# Patient Record
Sex: Male | Born: 1970 | Race: White | Hispanic: No | Marital: Married | Smoking: Never smoker
Health system: Southern US, Community
[De-identification: ages and names within clinical notes are randomized; demographics above are authoritative.]

## PROBLEM LIST (undated history)

## (undated) DIAGNOSIS — D126 Benign neoplasm of colon, unspecified: Secondary | ICD-10-CM

## (undated) DIAGNOSIS — T7840XA Allergy, unspecified, initial encounter: Secondary | ICD-10-CM

## (undated) DIAGNOSIS — R519 Headache, unspecified: Secondary | ICD-10-CM

## (undated) DIAGNOSIS — G4700 Insomnia, unspecified: Secondary | ICD-10-CM

## (undated) DIAGNOSIS — R931 Abnormal findings on diagnostic imaging of heart and coronary circulation: Secondary | ICD-10-CM

## (undated) DIAGNOSIS — C801 Malignant (primary) neoplasm, unspecified: Secondary | ICD-10-CM

## (undated) DIAGNOSIS — R0600 Dyspnea, unspecified: Secondary | ICD-10-CM

## (undated) DIAGNOSIS — R0609 Other forms of dyspnea: Secondary | ICD-10-CM

## (undated) DIAGNOSIS — R3129 Other microscopic hematuria: Secondary | ICD-10-CM

## (undated) DIAGNOSIS — E785 Hyperlipidemia, unspecified: Secondary | ICD-10-CM

## (undated) DIAGNOSIS — Z9081 Acquired absence of spleen: Secondary | ICD-10-CM

## (undated) DIAGNOSIS — L4050 Arthropathic psoriasis, unspecified: Secondary | ICD-10-CM

## (undated) DIAGNOSIS — R011 Cardiac murmur, unspecified: Secondary | ICD-10-CM

## (undated) DIAGNOSIS — E079 Disorder of thyroid, unspecified: Secondary | ICD-10-CM

## (undated) DIAGNOSIS — C8190 Hodgkin lymphoma, unspecified, unspecified site: Secondary | ICD-10-CM

## (undated) DIAGNOSIS — J984 Other disorders of lung: Secondary | ICD-10-CM

## (undated) HISTORY — PX: UPPER GASTROINTESTINAL ENDOSCOPY: SHX188

## (undated) HISTORY — DX: Malignant (primary) neoplasm, unspecified: C80.1

## (undated) HISTORY — DX: Other forms of dyspnea: R06.09

## (undated) HISTORY — DX: Hyperlipidemia, unspecified: E78.5

## (undated) HISTORY — DX: Headache, unspecified: R51.9

## (undated) HISTORY — DX: Acquired absence of spleen: Z90.81

## (undated) HISTORY — DX: Hodgkin lymphoma, unspecified, unspecified site: C81.90

## (undated) HISTORY — DX: Arthropathic psoriasis, unspecified: L40.50

## (undated) HISTORY — DX: Disorder of thyroid, unspecified: E07.9

## (undated) HISTORY — PX: ABDOMINAL EXPLORATION SURGERY: SHX538

## (undated) HISTORY — DX: Insomnia, unspecified: G47.00

## (undated) HISTORY — DX: Dyspnea, unspecified: R06.00

## (undated) HISTORY — DX: Abnormal findings on diagnostic imaging of heart and coronary circulation: R93.1

## (undated) HISTORY — DX: Allergy, unspecified, initial encounter: T78.40XA

## (undated) HISTORY — DX: Other disorders of lung: J98.4

## (undated) HISTORY — DX: Benign neoplasm of colon, unspecified: D12.6

## (undated) HISTORY — DX: Other microscopic hematuria: R31.29

## (undated) HISTORY — DX: Cardiac murmur, unspecified: R01.1

---

## 1997-09-17 ENCOUNTER — Encounter: Payer: Self-pay | Admitting: Endocrinology

## 2005-06-20 ENCOUNTER — Encounter: Payer: Self-pay | Admitting: Endocrinology

## 2006-05-21 ENCOUNTER — Encounter: Payer: Self-pay | Admitting: Endocrinology

## 2007-07-02 ENCOUNTER — Ambulatory Visit: Payer: Self-pay | Admitting: Endocrinology

## 2007-07-02 DIAGNOSIS — C8190 Hodgkin lymphoma, unspecified, unspecified site: Secondary | ICD-10-CM | POA: Insufficient documentation

## 2007-07-02 DIAGNOSIS — E032 Hypothyroidism due to medicaments and other exogenous substances: Secondary | ICD-10-CM | POA: Insufficient documentation

## 2007-07-02 LAB — CONVERTED CEMR LAB: TSH: 1.25 microintl units/mL (ref 0.35–5.50)

## 2013-02-02 ENCOUNTER — Other Ambulatory Visit (HOSPITAL_COMMUNITY): Payer: Self-pay

## 2013-02-09 ENCOUNTER — Encounter (INDEPENDENT_AMBULATORY_CARE_PROVIDER_SITE_OTHER): Payer: Self-pay

## 2013-02-09 ENCOUNTER — Other Ambulatory Visit (HOSPITAL_COMMUNITY): Payer: Self-pay | Admitting: Internal Medicine

## 2013-02-09 ENCOUNTER — Ambulatory Visit (HOSPITAL_COMMUNITY)
Admission: RE | Admit: 2013-02-09 | Discharge: 2013-02-09 | Disposition: A | Discharge status: Home / Self Care | Payer: BC Managed Care – PPO | Source: Ambulatory Visit | Attending: Surgery | Admitting: Surgery

## 2013-02-09 DIAGNOSIS — I6529 Occlusion and stenosis of unspecified carotid artery: Secondary | ICD-10-CM

## 2013-02-09 DIAGNOSIS — R0989 Other specified symptoms and signs involving the circulatory and respiratory systems: Secondary | ICD-10-CM

## 2015-11-01 ENCOUNTER — Ambulatory Visit: Payer: Self-pay

## 2015-11-01 ENCOUNTER — Encounter: Payer: Self-pay | Admitting: Podiatry

## 2015-11-01 ENCOUNTER — Ambulatory Visit (INDEPENDENT_AMBULATORY_CARE_PROVIDER_SITE_OTHER): Payer: BLUE CROSS/BLUE SHIELD | Admitting: Podiatry

## 2015-11-01 VITALS — BP 153/82 | HR 89 | Resp 16 | Ht 77.0 in | Wt 177.0 lb

## 2015-11-01 DIAGNOSIS — M722 Plantar fascial fibromatosis: Secondary | ICD-10-CM | POA: Diagnosis not present

## 2015-11-01 DIAGNOSIS — B070 Plantar wart: Secondary | ICD-10-CM | POA: Diagnosis not present

## 2015-11-01 NOTE — Patient Instructions (Signed)

## 2015-11-01 NOTE — Progress Notes (Signed)
   Subjective:    Patient ID: Ian Goodman, male    DOB: May 05, 1970, 45 y.o.   MRN: [id number]  HPI: He presents today as a new patient with a chief complaint of a wart to the plantar aspect of the left foot. He states it has been present for the past 6-7 years. He states that he has to trim and occasionally but for the most part it really doesn't bother him. He likes to exercise and play tennis and on occasion it will hurt with medial lateral compression.  Review of Systems  All other systems reviewed and are negative.      Objective:   Physical Exam: Vital signs are stable alert and oriented 3. Pulses are palpable. Neurologic sensorium is intact. Degenerative flexors are intact. Muscle strength is 5 over 5 dorsiflexion plantar flexors and inverters and everters all intrinsic musculature is intact. Orthopedic evaluation and strict all joints distal to the ankle have a full range of motion without crepitation. Cutaneous evaluation demonstrates supple well-hydrated cutis E has pinch calluses to the medial aspect of the hallux interphalangeal joints bilaterally he also has calluses to the plantar medial aspect of the first metatarsophalangeal joints bilaterally. A verrucoid lesion solitary nature measuring approximately 1 cm in diameter centrally located plantar aspect of the left heel is present. No signs of bacterial infection. No skin breakdown.        Assessment & Plan:  Assessment: Verruca plantaris left heel.  Plan: Surgical excision/curettage performed today after local anesthetic was administered beneath the lesion. He tolerated this procedure well without complications. The lesion was sent for pathologic evaluation. He was provided with both oral and written home-going instructions for care and soaking of the foot and I will follow-up with him in 1-2 weeks just to reevaluate the surgical site.

## 2015-11-15 ENCOUNTER — Encounter: Payer: Self-pay | Admitting: Podiatry

## 2015-11-15 ENCOUNTER — Ambulatory Visit (INDEPENDENT_AMBULATORY_CARE_PROVIDER_SITE_OTHER): Payer: BLUE CROSS/BLUE SHIELD | Admitting: Podiatry

## 2015-11-15 DIAGNOSIS — B070 Plantar wart: Secondary | ICD-10-CM

## 2015-11-15 NOTE — Patient Instructions (Signed)

## 2015-11-15 NOTE — Progress Notes (Signed)
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 He states that is doing much better than it did take a few days for do feel improved. He continues to apply a small amount of Neosporin and a Band-Aid. He denies any signs of infection.  Objective: Vital signs are stable alert and oriented 3. Pulses. Lesion to the plantar aspect of the left heel has nearly completely healed at this point. No signs of infection and no drainage no odor.  Assessment: Well-healing surgical curettage left foot.  Plan: Follow-up with [place] on an as-needed basis covered during the day time to completely healed and leave open at bedtime.

## 2016-06-27 ENCOUNTER — Other Ambulatory Visit: Payer: Self-pay | Admitting: Internal Medicine

## 2016-06-27 DIAGNOSIS — R0989 Other specified symptoms and signs involving the circulatory and respiratory systems: Secondary | ICD-10-CM

## 2016-07-04 ENCOUNTER — Other Ambulatory Visit: Payer: Self-pay

## 2016-07-09 ENCOUNTER — Other Ambulatory Visit: Payer: Self-pay

## 2016-07-16 ENCOUNTER — Ambulatory Visit
Admission: RE | Admit: 2016-07-16 | Discharge: 2016-07-16 | Disposition: A | Discharge status: Home / Self Care | Payer: BLUE CROSS/BLUE SHIELD | Source: Ambulatory Visit | Attending: Internal Medicine | Admitting: Internal Medicine

## 2016-07-16 DIAGNOSIS — R0989 Other specified symptoms and signs involving the circulatory and respiratory systems: Secondary | ICD-10-CM

## 2018-06-12 DIAGNOSIS — M25512 Pain in left shoulder: Secondary | ICD-10-CM | POA: Insufficient documentation

## 2018-07-08 ENCOUNTER — Other Ambulatory Visit: Payer: Self-pay | Admitting: Internal Medicine

## 2018-07-08 DIAGNOSIS — R0600 Dyspnea, unspecified: Secondary | ICD-10-CM

## 2018-07-08 DIAGNOSIS — R0609 Other forms of dyspnea: Principal | ICD-10-CM

## 2018-07-21 ENCOUNTER — Other Ambulatory Visit: Payer: Self-pay | Admitting: Internal Medicine

## 2018-07-21 ENCOUNTER — Other Ambulatory Visit (HOSPITAL_COMMUNITY): Payer: Self-pay | Admitting: Internal Medicine

## 2018-07-21 DIAGNOSIS — R0600 Dyspnea, unspecified: Secondary | ICD-10-CM

## 2018-07-21 DIAGNOSIS — R0609 Other forms of dyspnea: Secondary | ICD-10-CM

## 2018-07-21 DIAGNOSIS — R011 Cardiac murmur, unspecified: Secondary | ICD-10-CM

## 2018-09-19 ENCOUNTER — Encounter (HOSPITAL_COMMUNITY): Payer: Self-pay | Admitting: Internal Medicine

## 2018-10-20 ENCOUNTER — Encounter: Payer: Self-pay | Admitting: Internal Medicine

## 2019-04-27 ENCOUNTER — Telehealth: Payer: Self-pay | Admitting: Gastroenterology

## 2019-04-27 NOTE — Telephone Encounter (Signed)
Dr Ardis Hughs there were no dates that worked for him.  He is going to call back ina couple weeks when the the new schedule is out and try to find something at that time.

## 2019-04-27 NOTE — Telephone Encounter (Signed)
Can you see if he wants to do Friday 7:30 March 19th?  thanks

## 2019-04-27 NOTE — Telephone Encounter (Signed)
He goes by [employer].  He's a friend of mine at routine risk for colon cancer.  Needs screening colonoscopy with me in next several weeks, he definitely prefers an early Friday morning case.  Can you contact him about it? He's reachable via his cell.   Thanks

## 2019-04-27 NOTE — Telephone Encounter (Signed)
Left message on machine to call back  

## 2019-04-28 ENCOUNTER — Other Ambulatory Visit: Payer: Self-pay

## 2019-04-28 DIAGNOSIS — Z1159 Encounter for screening for other viral diseases: Secondary | ICD-10-CM

## 2019-04-28 NOTE — Telephone Encounter (Signed)
The pt has been scheduled for 05/29/19 at 730 am colon with Dr Ardis Hughs at the [facility].  Previsit and COVID also scheduled.  Information given to the pt

## 2019-04-28 NOTE — Telephone Encounter (Signed)
The COVID appt has been cancelled due to the pt having the full vaccination for COVID.

## 2019-05-25 ENCOUNTER — Ambulatory Visit (AMBULATORY_SURGERY_CENTER): Payer: Self-pay | Admitting: *Deleted

## 2019-05-25 ENCOUNTER — Other Ambulatory Visit: Payer: Self-pay

## 2019-05-25 VITALS — Temp 97.4°F | Ht 77.0 in | Wt 179.8 lb

## 2019-05-25 DIAGNOSIS — Z1211 Encounter for screening for malignant neoplasm of colon: Secondary | ICD-10-CM

## 2019-05-25 MED ORDER — SUPREP BOWEL PREP KIT 17.5-3.13-1.6 GM/177ML PO SOLN: 1.0000 | Freq: Once | ORAL | 0 refills | Status: AC

## 2019-05-25 NOTE — Progress Notes (Signed)
Patient denies any allergies to egg or soy products. Patient denies complications with anesthesia/sedation.  Patient denies oxygen use at home and denies diet medications. Emmi instructions for colonoscopy/endoscopy explained and given to patient.  Suprep coupon given to patient.  Patient has had both covid vaccinations.  Last vaccination was on 04/21/19 per patient.

## 2019-05-27 ENCOUNTER — Telehealth: Payer: Self-pay

## 2019-05-27 ENCOUNTER — Encounter: Payer: Self-pay | Admitting: Gastroenterology

## 2019-05-27 NOTE — Telephone Encounter (Signed)
Called pt regarding cancelled COVID test that's required for upcoming scheduled colonoscopy.  No answer.  LMTCB (ok per DPR).

## 2019-05-29 ENCOUNTER — Other Ambulatory Visit: Payer: Self-pay

## 2019-05-29 ENCOUNTER — Encounter: Payer: Self-pay | Admitting: Gastroenterology

## 2019-05-29 ENCOUNTER — Ambulatory Visit (AMBULATORY_SURGERY_CENTER): Payer: BC Managed Care – PPO | Admitting: Gastroenterology

## 2019-05-29 VITALS — BP 114/67 | HR 85 | Temp 98.2°F | Resp 18 | Ht 77.0 in | Wt 179.8 lb

## 2019-05-29 DIAGNOSIS — D120 Benign neoplasm of cecum: Secondary | ICD-10-CM | POA: Diagnosis not present

## 2019-05-29 DIAGNOSIS — Z1211 Encounter for screening for malignant neoplasm of colon: Secondary | ICD-10-CM

## 2019-05-29 DIAGNOSIS — D123 Benign neoplasm of transverse colon: Secondary | ICD-10-CM | POA: Diagnosis not present

## 2019-05-29 DIAGNOSIS — D125 Benign neoplasm of sigmoid colon: Secondary | ICD-10-CM

## 2019-05-29 DIAGNOSIS — D122 Benign neoplasm of ascending colon: Secondary | ICD-10-CM | POA: Diagnosis not present

## 2019-05-29 MED ORDER — SODIUM CHLORIDE 0.9 % IV SOLN: 500.0000 mL | Freq: Once | INTRAVENOUS | Status: DC

## 2019-05-29 NOTE — Op Note (Signed)
[facility] Patient Name: Ian Goodman Procedure Date: 05/29/2019 7:21 AM MRN: [id number] Endoscopist: Milus Banister , MD Age: 49 Referring MD:  Date of Birth: 06/30/1970 Gender: Male Account #: [id number] Procedure:                Colonoscopy Indications:              Screening for colorectal malignant neoplasm Medicines:                Monitored Anesthesia Care Procedure:                Pre-Anesthesia Assessment:                           - Prior to the procedure, a History and Physical                            was performed, and patient medications and                            allergies were reviewed. The patient's tolerance of                            previous anesthesia was also reviewed. The risks                            and benefits of the procedure and the sedation                            options and risks were discussed with the patient.                            All questions were answered, and informed consent                            was obtained. Prior Anticoagulants: The patient has                            taken no previous anticoagulant or antiplatelet                            agents. ASA Grade Assessment: II - A patient with                            mild systemic disease. After reviewing the risks                            and benefits, the patient was deemed in                            satisfactory condition to undergo the procedure.                           After obtaining informed consent, the colonoscope  was passed under direct vision. Throughout the                            procedure, the patient's blood pressure, pulse, and                            oxygen saturations were monitored continuously. The                            Colonoscope was introduced through the anus and                            advanced to the the cecum, identified by                            appendiceal orifice and  ileocecal valve. The                            colonoscopy was performed without difficulty. The                            patient tolerated the procedure well. The quality                            of the bowel preparation was excellent. The                            ileocecal valve, appendiceal orifice, and rectum                            were photographed. Scope In: 7:35:27 AM Scope Out: 8:01:29 AM Scope Withdrawal Time: 0 hours 17 minutes 58 seconds  Total Procedure Duration: 0 hours 26 minutes 2 seconds  Findings:                 Nine sessile polyps were found in the transverse                            colon, ascending colon and cecum. The polyps were 2                            to 6 mm in size. These polyps were removed with a                            cold snare. Resection and retrieval were complete.                           Three semi-pedunculated polyps were found in the                            sigmoid colon. The polyps were 2 to 10 mm in size.                            These polyps  were removed with a cold snare.                            Resection and retrieval were complete.                           Internal hemorrhoids were found. The hemorrhoids                            were small.                           The exam was otherwise without abnormality on                            direct and retroflexion views. Complications:            No immediate complications. Estimated blood loss:                            None. Estimated Blood Loss:     Estimated blood loss: none. Impression:               - Nine 2 to 6 mm polyps in the transverse colon, in                            the ascending colon and in the cecum, removed with                            a cold snare. Resected and retrieved.                           - Three 2 to 10 mm polyps in the sigmoid colon,                            removed with a cold snare. Resected and retrieved.                            - Internal hemorrhoids.                           - The examination was otherwise normal on direct                            and retroflexion views. Recommendation:           - Patient has a contact number available for                            emergencies. The signs and symptoms of potential                            delayed complications were discussed with the                            patient. Return to normal activities  tomorrow.                            Written discharge instructions were provided to the                            patient.                           - Resume previous diet.                           - Continue present medications.                           - Await pathology results. Likely repeat in 1-3                            years pending path results. Milus Banister, MD 05/29/2019 8:05:22 AM This report has been signed electronically.

## 2019-05-29 NOTE — Patient Instructions (Signed)
Handouts on polyps and hemorrhoids given to you today  Await pathology results    YOU HAD AN ENDOSCOPIC PROCEDURE TODAY AT THE [facility]:   Refer to the procedure report that was given to you for any specific questions about what was found during the examination.  If the procedure report does not answer your questions, please call your gastroenterologist to clarify.  If you requested that your care partner not be given the details of your procedure findings, then the procedure report has been included in a sealed envelope for you to review at your convenience later.  YOU SHOULD EXPECT: Some feelings of bloating in the abdomen. Passage of more gas than usual.  Walking can help get rid of the air that was put into your GI tract during the procedure and reduce the bloating. If you had a lower endoscopy (such as a colonoscopy or flexible sigmoidoscopy) you may notice spotting of blood in your stool or on the toilet paper. If you underwent a bowel prep for your procedure, you may not have a normal bowel movement for a few days.  Please Note:  You might notice some irritation and congestion in your nose or some drainage.  This is from the oxygen used during your procedure.  There is no need for concern and it should clear up in a day or so.  SYMPTOMS TO REPORT IMMEDIATELY:   Following lower endoscopy (colonoscopy or flexible sigmoidoscopy):  Excessive amounts of blood in the stool  Significant tenderness or worsening of abdominal pains  Swelling of the abdomen that is new, acute  Fever of 100F or higher  For urgent or emergent issues, a gastroenterologist can be reached at any hour by calling [phone]. Do not use MyChart messaging for urgent concerns.    DIET:  We do recommend a small meal at first, but then you may proceed to your regular diet.  Drink plenty of fluids but you should avoid alcoholic beverages for 24 hours.  ACTIVITY:  You should plan to take it easy for the  rest of today and you should NOT DRIVE or use heavy machinery until tomorrow (because of the sedation medicines used during the test).    FOLLOW UP: Our staff will call the number listed on your records 48-72 hours following your procedure to check on you and address any questions or concerns that you may have regarding the information given to you following your procedure. If we do not reach you, we will leave a message.  We will attempt to reach you two times.  During this call, we will ask if you have developed any symptoms of COVID 19. If you develop any symptoms (ie: fever, flu-like symptoms, shortness of breath, cough etc.) before then, please call [phone].  If you test positive for Covid 19 in the 2 weeks post procedure, please call and report this information to us.    If any biopsies were taken you will be contacted by phone or by letter within the next 1-3 weeks.  Please call us at [phone] if you have not heard about the biopsies in 3 weeks.    SIGNATURES/CONFIDENTIALITY: You and/or your care partner have signed paperwork which will be entered into your electronic medical record.  These signatures attest to the fact that that the information above on your After Visit Summary has been reviewed and is understood.  Full responsibility of the confidentiality of this discharge information lies with you and/or your care-partner. 

## 2019-05-29 NOTE — Progress Notes (Signed)
Called to room to assist during endoscopic procedure.  Patient ID and intended procedure confirmed with present staff. Received instructions for my participation in the procedure from the performing physician.  

## 2019-05-29 NOTE — Progress Notes (Signed)
Pt's states no medical or surgical changes since previsit or office visit. 

## 2019-05-29 NOTE — Progress Notes (Signed)
PT taken to PACU. Monitors in place. VSS. Report given to RN. 

## 2019-06-02 ENCOUNTER — Telehealth: Payer: Self-pay | Admitting: *Deleted

## 2019-06-02 NOTE — Telephone Encounter (Signed)
  Follow up Call-  Call back number 05/29/2019  Post procedure Call Back phone  # [phone]  Permission to leave phone message Yes  Some recent data might be hidden    [facility]

## 2019-06-02 NOTE — Telephone Encounter (Signed)
  Follow up Call-  Call back number 05/29/2019  Post procedure Call Back phone  # [phone]  Permission to leave phone message Yes  Some recent data might be hidden     Patient questions:  Do you have a fever, pain , or abdominal swelling? No. Pain Score  0 *  Have you tolerated food without any problems? Yes.    Have you been able to return to your normal activities? Yes.    Do you have any questions about your discharge instructions: Diet   No. Medications  No. Follow up visit  No.  Do you have questions or concerns about your Care? No.  Actions: * If pain score is 4 or above: No action needed, pain <4.  1. Have you developed a fever since your procedure? no  2.   Have you had an respiratory symptoms (SOB or cough) since your procedure? no  3.   Have you tested positive for COVID 19 since your procedure no  4.   Have you had any family members/close contacts diagnosed with the COVID 19 since your procedure?  no   If yes to any of these questions please route to Joylene John, RN and Erenest Rasher, RN

## 2019-06-04 ENCOUNTER — Encounter: Payer: Self-pay | Admitting: Gastroenterology

## 2019-08-11 HISTORY — PX: SHOULDER SURGERY: SHX246

## 2019-08-31 DIAGNOSIS — M25612 Stiffness of left shoulder, not elsewhere classified: Secondary | ICD-10-CM | POA: Insufficient documentation

## 2020-08-01 ENCOUNTER — Other Ambulatory Visit: Payer: Self-pay | Admitting: Internal Medicine

## 2020-08-01 DIAGNOSIS — E785 Hyperlipidemia, unspecified: Secondary | ICD-10-CM

## 2020-08-16 ENCOUNTER — Other Ambulatory Visit: Payer: Self-pay

## 2020-08-16 ENCOUNTER — Ambulatory Visit (INDEPENDENT_AMBULATORY_CARE_PROVIDER_SITE_OTHER): Payer: BC Managed Care – PPO | Admitting: Sports Medicine

## 2020-08-16 ENCOUNTER — Encounter: Payer: Self-pay | Admitting: Sports Medicine

## 2020-08-16 ENCOUNTER — Ambulatory Visit: Payer: Self-pay

## 2020-08-16 VITALS — BP 120/78 | Ht 77.0 in | Wt 170.0 lb

## 2020-08-16 DIAGNOSIS — M25512 Pain in left shoulder: Secondary | ICD-10-CM | POA: Diagnosis not present

## 2020-08-16 DIAGNOSIS — S43432S Superior glenoid labrum lesion of left shoulder, sequela: Secondary | ICD-10-CM | POA: Insufficient documentation

## 2020-08-16 MED ORDER — NITROGLYCERIN 0.2 MG/HR TD PT24: MEDICATED_PATCH | TRANSDERMAL | 1 refills | Status: DC

## 2020-08-16 NOTE — Progress Notes (Signed)
  Bassam Dresch - 50 y.o. male MRN [id number]  Date of birth: 10/06/1970  SUBJECTIVE:    CC: Left Shoulder Pain  Ian Goodman presents with complaints of left shoulder pain 1 year out from a SLAP repair. He is left handed. 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 During surgery he was found to have a labral tear which was repaired. He began with PT but had some setbacks 2/2 an injury sustained about 5 weeks out from the surgery. He feels he progressed okay but is still having discomfort in the anterolateral portion of his shoulder just lateral to the surgical incision. He feels that his shoulder is tight. He has been doing some exercises to strengthen the shoulder and icing with minimal relief. He takes anti inflammatories sometimes as needed. He did have some frozen shoulder after he was able to remove the sling after surgery. He denies any radicular symptoms.    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 He has been able to do pickle ball without much pain.  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   Objective:  VS: BP:120/78  HR: bpm  TEMP: ( )  RESP:   HT:6\' 5"  (195.6 cm)   WT:170 lb (77.1 kg)  BMI:20.15 PHYSICAL EXAM:  Well appearing tall male in NAD. A&Ox3  Chest:  Atraumatic with pectus excavatum.  Left Shoulder:  Symmetric muscle mass, no atrophy, Incision at the anterior shoulder. Incision at the previous site of the [facility] joint with significant stepoff.  Winging of the scapula with scapular dyskinesis. TTP at the anterolateral shoulder along the biceps tendon. Nontender at the [facility] joint incision site.  FROM in abduction, forward flexion, extension, IR and ER.  Negative Empty can, lift off, speeds, yergasons, neers, and hawkins. 5/5 strength with abduction, extension, flexion, IR, and ER.  Left Shoulder Complete Ultrasound: Biceps tendon visualized in long and short  axis, with mild effusion and slight disruption of fibers at the proximal aspect. Signs of surgical changes where the biceps tendon enters the RC superiorly -- likely 2/2 surgery.  Some Hypoechoic changes at superior biceps and area where anterior labrum would be reattached Subscapularis tendon intact, with no significant surrounding effusion or impingement. Supraspinatus appears intact, no significant cortical irregularities, effusions, or intratendinous defects. No significant enlargement of subacromial bursa appreciated. Infraspinatus and teres minor both appear intact, without surrounding fluid. AC joint not able to be visualized due to prior surgery.   ASSESSMENT & PLAN:  Left Shoulder Pain: He is having shoulder pain at the site of the labral repair done 1 year ago. He is also having significant scapular winging that is contributing to his pain with certain movements. He has tried shoulder PT in the past. We will give him exercises to improve his scapular winging and strengthen his shoulder stability. He can also use nitro patches on the area of discomfort to improve healing of the joint. He will follow up in 6 weeks for further evaluation.  Marcelino Duster, MS4   I observed and examined the patient with the MS4 and agree with assessment and plan.  Note reviewed and modified by me.  I think a lot of his ongoing pain relates to abnormal shoulder position with forward protraction as well as scapular dysfunction. HEP to reverse this.  Ila Mcgill, MD

## 2020-08-16 NOTE — Patient Instructions (Addendum)
It was great to see you today!  -Please start doing the following exercises with a 3 lb weight for your scapular winging. -Keep pain less than a 3/10 during exercise. -Start using nitro patches. See the protocol below. -Follow up in 6 weeks.   Nitroglycerin Protocol   Apply 1/4 nitroglycerin patch to affected area daily.  Change position of patch within the affected area every 24 hours.  You may experience a headache during the first 1-2 weeks of using the patch, these should subside.  If you experience headaches after beginning nitroglycerin patch treatment, you may take your preferred over the counter pain reliever.  Another side effect of the nitroglycerin patch is skin irritation or rash related to patch adhesive.  Please notify our office if you develop more severe headaches or rash, and stop the patch.  Tendon healing with nitroglycerin patch may require 12 to 24 weeks depending on the extent of injury.  Men should not use if taking Viagra, Cialis, or Levitra.   Do not use if you have migraines or rosacea.

## 2020-08-16 NOTE — Assessment & Plan Note (Signed)
HEP to emphasize scapular stabilization Posture work to include I and T stretches Trial on NTG  See if we can improve this over the next 6 weeks and then return 4 reck

## 2020-09-05 ENCOUNTER — Ambulatory Visit
Admission: RE | Admit: 2020-09-05 | Discharge: 2020-09-05 | Disposition: A | Discharge status: Home / Self Care | Payer: BC Managed Care – PPO | Source: Ambulatory Visit | Attending: Internal Medicine | Admitting: Internal Medicine

## 2020-09-05 DIAGNOSIS — E785 Hyperlipidemia, unspecified: Secondary | ICD-10-CM

## 2020-09-20 ENCOUNTER — Other Ambulatory Visit: Payer: Self-pay

## 2020-09-20 ENCOUNTER — Ambulatory Visit (INDEPENDENT_AMBULATORY_CARE_PROVIDER_SITE_OTHER): Payer: BC Managed Care – PPO | Admitting: Cardiology

## 2020-09-20 VITALS — BP 112/70 | HR 82 | Ht 77.0 in | Wt 175.0 lb

## 2020-09-20 DIAGNOSIS — R0602 Shortness of breath: Secondary | ICD-10-CM

## 2020-09-20 DIAGNOSIS — Z01818 Encounter for other preprocedural examination: Secondary | ICD-10-CM

## 2020-09-20 DIAGNOSIS — R931 Abnormal findings on diagnostic imaging of heart and coronary circulation: Secondary | ICD-10-CM

## 2020-09-20 DIAGNOSIS — Z01812 Encounter for preprocedural laboratory examination: Secondary | ICD-10-CM

## 2020-09-20 LAB — BASIC METABOLIC PANEL
BUN/Creatinine Ratio: 24 — ABNORMAL HIGH (ref 9–20)
BUN: 22 mg/dL (ref 6–24)
CO2: 29 mmol/L (ref 20–29)
Calcium: 10.1 mg/dL (ref 8.7–10.2)
Chloride: 103 mmol/L (ref 96–106)
Creatinine, Ser: 0.92 mg/dL (ref 0.76–1.27)
Glucose: 83 mg/dL (ref 65–99)
Potassium: 5.3 mmol/L — ABNORMAL HIGH (ref 3.5–5.2)
Sodium: 140 mmol/L (ref 134–144)
eGFR: 101 mL/min/{1.73_m2} (ref >59)

## 2020-09-20 LAB — CBC
Hematocrit: 42.5 % (ref 37.5–51.0)
Hemoglobin: 14.4 g/dL (ref 13.0–17.7)
MCH: 31.9 pg (ref 26.6–33.0)
MCHC: 33.9 g/dL (ref 31.5–35.7)
MCV: 94 fL (ref 79–97)
Platelets: 285 10*3/uL (ref 150–450)
RBC: 4.52 x10E6/uL (ref 4.14–5.80)
RDW: 13.5 % (ref 11.6–15.4)
WBC: 9 10*3/uL (ref 3.4–10.8)

## 2020-09-20 MED ORDER — ROSUVASTATIN CALCIUM 20 MG PO TABS: 20.0000 mg | ORAL_TABLET | Freq: Every day | ORAL | 3 refills | Status: DC

## 2020-09-20 MED ORDER — ASPIRIN EC 81 MG PO TBEC: 81.0000 mg | DELAYED_RELEASE_TABLET | Freq: Every day | ORAL | 3 refills | Status: AC

## 2020-09-20 NOTE — Patient Instructions (Signed)
Medication Instructions Please increase your Crestor to 20 mg a day. Start Aspirin 81 mg a day. Continue all other medications as listed.  *If you need a refill on your cardiac medications before your next appointment, please call your pharmacy*  Lab Work: Please have blood work today.   (CBC, BMP) If you have labs (blood work) drawn today and your tests are completely normal, you will receive your results only by: [place] (if you have MyChart) OR A paper copy in the mail If you have any lab test that is abnormal or we need to change your treatment, we will call you to review the results.  Testing/Procedures: Your physician has requested that you have an echocardiogram. Echocardiography is a painless test that uses sound waves to create images of your heart. It provides your doctor with information about the size and shape of your heart and how well your heart's chambers and valves are working. This procedure takes approximately one hour. There are no restrictions for this procedure.    Ian [facility] OFFICE [place], Ian Goodman [id number] Dept: [phone] Loc: [phone]  Ian Goodman  09/20/2020  You are scheduled for a Cardiac Catheterization on Thursday, July 21 with Dr. Daneen Schick.  1. Please arrive at the [facility] (Main Entrance A) at [facility]: [place], [place] [address] at 7:00 AM (two hours before your procedure to ensure your preparation). Free valet parking service is available.   Special note: Every effort is made to have your procedure done on time. Please understand that emergencies sometimes delay scheduled procedures.  2. Diet: Do not eat or drink anything after midnight prior to your procedure except sips of water to take medications.  3. Labs:  Today 7/12 (CBC, BMP)  4. Medication instructions in preparation for your  procedure:  On the morning of your procedure, take your Aspirin and any morning medicines NOT listed above.  You may use sips of water.  5. Plan for one night stay--bring personal belongings. 6. Bring a current list of your medications and current insurance cards. 7. You MUST have a responsible person to drive you home. 8. Someone MUST be with you the first 24 hours after you arrive home or your discharge will be delayed. 9. Please wear clothes that are easy to get on and off and wear slip-on shoes.  Thank you for allowing [place] to care for you!   -- [facility]  Follow-Up: At [facility], you and your health needs are our priority.  As part of our continuing mission to provide you with exceptional heart care, we have created designated Provider Care Teams.  These Care Teams include your primary Cardiologist (physician) and Advanced Practice Providers (APPs -  Physician Assistants and Nurse Practitioners) who all work together to provide you with the care you need, when you need it.  We recommend signing up for the patient portal called "MyChart".  Sign up information is provided on this After Visit Summary.  MyChart is used to connect with patients for Virtual Visits (Telemedicine).  Patients are able to view lab/test results, encounter notes, upcoming appointments, etc.  Non-urgent messages can be sent to your provider as well.   To learn more about what you can do with MyChart, go to [email or web address].    Your next appointment:   3 month(s)  The format for your next appointment:   In  Person  Provider:   Candee Furbish, MD   Thank you for choosing [facility]!!

## 2020-09-20 NOTE — Progress Notes (Signed)
Cardiology Office Note:    Date:  09/20/2020   ID:  [name], DOB [birth year], MRN [id number]  PCP:  Ginger Organ., MD   [facility] Providers Cardiologist:  None     Referring MD: Ginger Organ., MD     History of Present Illness:    Ian Goodman is a 50 y.o. male with prior history of Hodgkin's lymphoma in his teens with chest wall radiation here for the evaluation of elevated coronary calcium score of 754, 99 percentile with aortic valve calcifications as well, with symptoms of ongoing periodic dyspnea on exertion rare episodes of chest discomfort at the request of Dr Lang Snow  There is ostial left-sided disease, proximal RCA disease noted.  No pericardial calcification given his prior radiation.  His aorta dimensions are normal given his tall and thin stature.  He does have some aortic valve calcifications.  He has had 2 distinct episodes of substernal sensation of pressure/discomfort with heavy exertion such as playing tennis.  These were relieved with rest.  There were associated with shortness of breath.  Overall, he does experience of shortness of breath with heavy activity but this has been going on for several years he states.  In fact after he had his chemotherapy and radiation for Hodgkin's in his late teens, he has had bouts of deconditioning at times.    Prior creatinine 1.0 glucose 81 potassium 5.0 ALT 26 hemoglobin 15.2 platelet count 301.  Total cholesterol 197 HDL 52 LDL 136 triglycerides 45.  TSH 2.18 Free T4 1.2  Past Medical History:  Diagnosis Date   Adenomatous polyp of colon    Allergy    Cancer ([place])    hodgins lymphona   Cardiac murmur    Dyspnea on exertion    Elevated coronary artery calcium score    H/O splenectomy    Headache    Hematuria, microscopic    Hodgkin lymphoma (HCC)    Hyperlipidemia    Insomnia    PSA (psoriatic arthritis) ([place])    Restrictive lung disease    Thyroid disease     Past Surgical History:   Procedure Laterality Date   ABDOMINAL EXPLORATION SURGERY     spleen removed   UPPER GASTROINTESTINAL ENDOSCOPY     ucler - 15 yrs ago    Current Medications: Current Meds  Medication Sig   aspirin EC 81 MG tablet Take 1 tablet (81 mg total) by mouth daily. Swallow whole.   levothyroxine (SYNTHROID, LEVOTHROID) 88 MCG tablet    rosuvastatin (CRESTOR) 20 MG tablet Take 1 tablet (20 mg total) by mouth daily.   [DISCONTINUED] rosuvastatin (CRESTOR) 10 MG tablet Take 10 mg by mouth daily.     Allergies:   Compazine  [prochlorperazine], Penicillins, and Prochlorperazine edisylate   Social History   Socioeconomic History   Marital status: Married    Spouse name: Not on file   Number of children: Not on file   Years of education: Not on file   Highest education level: Not on file  Occupational History   Not on file  Tobacco Use   Smoking status: Never   Smokeless tobacco: Never  Vaping Use   Vaping Use: Never used  Substance and Sexual Activity   Alcohol use: Yes    Comment: occasional wine - 2-3 x month   Drug use: Never   Sexual activity: Yes  Other Topics Concern   Not on file  Social History Narrative   Not on file  Social Determinants of Health   Financial Resource Strain: Not on file  Food Insecurity: Not on file  Transportation Needs: Not on file  Physical Activity: Not on file  Stress: Not on file  Social Connections: Not on file     Family History: The patient's family history is negative for Colon cancer, Rectal cancer, and Stomach cancer.  ROS:   Please see the history of present illness.     All other systems reviewed and are negative.  EKGs/Labs/Other Studies Reviewed:    The following studies were reviewed today: Coronary calcium score personally reviewed with him, showed him the images.  EKG:  EKG is  ordered today.  The ekg ordered today demonstrates sinus rhythm 82  Recent Labs: No results found for requested labs within last 8760  hours.  Recent Lipid Panel No results found for: CHOL, TRIG, HDL, CHOLHDL, VLDL, LDLCALC, LDLDIRECT   Risk Assessment/Calculations:          Physical Exam:    VS:  BP 112/70 (BP Location: Left Arm, Patient Position: Sitting, Cuff Size: Normal)   Pulse 82   Ht 6\' 5"  (1.956 m)   Wt 175 lb (79.4 kg)   SpO2 98%   BMI 20.75 kg/m     Wt Readings from Last 3 Encounters:  09/20/20 175 lb (79.4 kg)  08/16/20 170 lb (77.1 kg)  05/29/19 179 lb 12.8 oz (81.6 kg)     GEN:  Well nourished, well developed in no acute distress tall, thin HEENT: Normal NECK: No JVD; soft left carotid bruit LYMPHATICS: No lymphadenopathy CARDIAC: RRR, soft systolic murmur, no rubs, gallops RESPIRATORY:  Clear to auscultation without rales, wheezing or rhonchi  ABDOMEN: Soft, non-tender, non-distended MUSCULOSKELETAL:  No edema; No deformity  SKIN: Warm and dry NEUROLOGIC:  Alert and oriented x 3 PSYCHIATRIC:  Normal affect   ASSESSMENT:    1. Shortness of breath   2. Elevated coronary artery calcium score   3. Pre-procedure lab exam    PLAN:    In order of problems listed above:  Coronary artery disease/abnormal cardiac CT/dyspnea/chest discomfort - Given his 99th percentile coronary artery score and symptoms albeit periodic, we will proceed with cardiac catheterization with possible percutaneous intervention.  Risks and benefits including stroke heart attack death renal impairment bleeding have been discussed.  He is willing to proceed.  Next Thursday, July 21.  Dr. Tamala Julian. -I would like to make sure that we get a firm diagnosis for any luminal stenosis with angiogram. -We have increased his Crestor from 10 mg up to 20 mg a day.  He is tolerating the 10 mg well no myalgias.  High intensity statin.  We have advocated for aspirin 81 mg a day.  Watch for any signs of bleeding.   Prior history of Hodgkin's lymphoma - Extensive chest wall radiation performed in his late teens.  He also had MOP  therapy.  There is no evidence of pericardial calcification seen on CT scan.  Reassuring given his dyspnea.  No evidence of pericardial constriction.   Aortic valve sclerosis/dyspnea - We will check an echocardiogram to ensure proper structure and function and valvular morphology.  Family history of CAD - On his mother side, his mother's father and 2 brothers.  One had percutaneous interventions, one had bypass.  He states that they did grow up in [place], perhaps dietary factors played a role.  Continue with aggressive risk factor modification.  Shared Decision Making/Informed Consent The risks [stroke (1 in 1000), death (1  in 1000), kidney failure [usually temporary] (1 in 500), bleeding (1 in 200), allergic reaction [possibly serious] (1 in 200)], benefits (diagnostic support and management of coronary artery disease) and alternatives of a cardiac catheterization were discussed in detail with Mr. Schlender and he is willing to proceed.    Medication Adjustments/Labs and Tests Ordered: Current medicines are reviewed at length with the patient today.  Concerns regarding medicines are outlined above.  Orders Placed This Encounter  Procedures   CBC   Basic metabolic panel   EKG 40-NUUV   ECHOCARDIOGRAM COMPLETE   Meds ordered this encounter  Medications   rosuvastatin (CRESTOR) 20 MG tablet    Sig: Take 1 tablet (20 mg total) by mouth daily.    Dispense:  90 tablet    Refill:  3   aspirin EC 81 MG tablet    Sig: Take 1 tablet (81 mg total) by mouth daily. Swallow whole.    Dispense:  90 tablet    Refill:  3    Patient Instructions  Medication Instructions Please increase your Crestor to 20 mg a day. Start Aspirin 81 mg a day. Continue all other medications as listed.  *If you need a refill on your cardiac medications before your next appointment, please call your pharmacy*  Lab Work: Please have blood work today.   (CBC, BMP) If you have labs (blood work) drawn today and  your tests are completely normal, you will receive your results only by: [place] (if you have MyChart) OR A paper copy in the mail If you have any lab test that is abnormal or we need to change your treatment, we will call you to review the results.  Testing/Procedures: Your physician has requested that you have an echocardiogram. Echocardiography is a painless test that uses sound waves to create images of your heart. It provides your doctor with information about the size and shape of your heart and how well your heart's chambers and valves are working. This procedure takes approximately one hour. There are no restrictions for this procedure.    [place] OFFICE [place], [place] [address] Dept: [phone] Loc: [phone]  Shea Kapur  09/20/2020  You are scheduled for a Cardiac Catheterization on Thursday, July 21 with Dr. Daneen Schick.  1. Please arrive at the [facility] (Main Entrance A) at [facility]: [place], [place] [address] at 7:00 AM (two hours before your procedure to ensure your preparation). Free valet parking service is available.   Special note: Every effort is made to have your procedure done on time. Please understand that emergencies sometimes delay scheduled procedures.  2. Diet: Do not eat or drink anything after midnight prior to your procedure except sips of water to take medications.  3. Labs:  Today 7/12 (CBC, BMP)  4. Medication instructions in preparation for your procedure:  On the morning of your procedure, take your Aspirin and any morning medicines NOT listed above.  You may use sips of water.  5. Plan for one night stay--bring personal belongings. 6. Bring a current list of your medications and current insurance cards. 7. You MUST have a responsible person to drive you home. 8. Someone MUST be with you the first  24 hours after you arrive home or your discharge will be delayed. 9. Please wear clothes that are easy to get on and off and wear slip-on shoes.  Thank you  for allowing [place] to care for you!   -- [place] [facility]  Follow-Up: At [facility], you and your health needs are our priority.  As part of our continuing mission to provide you with exceptional heart care, we have created designated Provider Care Teams.  These Care Teams include your primary Cardiologist (physician) and Advanced Practice Providers (APPs -  Physician Assistants and Nurse Practitioners) who all work together to provide you with the care you need, when you need it.  We recommend signing up for the patient portal called "MyChart".  Sign up information is provided on this After Visit Summary.  MyChart is used to connect with patients for Virtual Visits (Telemedicine).  Patients are able to view lab/test results, encounter notes, upcoming appointments, etc.  Non-urgent messages can be sent to your provider as well.   To learn more about what you can do with MyChart, go to [email or web address].    Your next appointment:   3 month(s)  The format for your next appointment:   In Person  Provider:   Candee Furbish, MD   Thank you for choosing [facility]!!     Signed, Candee Furbish, MD  09/20/2020 10:39 AM    [place]

## 2020-09-20 NOTE — H&P (View-Only) (Signed)
Cardiology Office Note:    Date:  09/20/2020   ID:  Ian Goodman, DOB 1970-06-05, MRN [id number]  PCP:  Ginger Organ., MD   [facility] Providers Cardiologist:  None     Referring MD: Ginger Organ., MD     History of Present Illness:    Ian Goodman is a 50 y.o. male with prior history of Hodgkin's lymphoma in his teens with chest wall radiation here for the evaluation of elevated coronary calcium score of 754, 99 percentile with aortic valve calcifications as well, with symptoms of ongoing periodic dyspnea on exertion rare episodes of chest discomfort at the request of Dr Lang Snow  There is ostial left-sided disease, proximal RCA disease noted.  No pericardial calcification given his prior radiation.  His aorta dimensions are normal given his tall and thin stature.  He does have some aortic valve calcifications.  He has had 2 distinct episodes of substernal sensation of pressure/discomfort with heavy exertion such as playing tennis.  These were relieved with rest.  There were associated with shortness of breath.  Overall, he does experience of shortness of breath with heavy activity but this has been going on for several years he states.  In fact after he had his chemotherapy and radiation for Hodgkin's in his late teens, he has had bouts of deconditioning at times.    Prior creatinine 1.0 glucose 81 potassium 5.0 ALT 26 hemoglobin 15.2 platelet count 301.  Total cholesterol 197 HDL 52 LDL 136 triglycerides 45.  TSH 2.18 Free T4 1.2  Past Medical History:  Diagnosis Date   Adenomatous polyp of colon    Allergy    Cancer ([place])    hodgins lymphona   Cardiac murmur    Dyspnea on exertion    Elevated coronary artery calcium score    H/O splenectomy    Headache    Hematuria, microscopic    Hodgkin lymphoma (HCC)    Hyperlipidemia    Insomnia    PSA (psoriatic arthritis) ([place])    Restrictive lung disease    Thyroid disease     Past Surgical History:   Procedure Laterality Date   ABDOMINAL EXPLORATION SURGERY     spleen removed   UPPER GASTROINTESTINAL ENDOSCOPY     ucler - 15 yrs ago    Current Medications: Current Meds  Medication Sig   aspirin EC 81 MG tablet Take 1 tablet (81 mg total) by mouth daily. Swallow whole.   levothyroxine (SYNTHROID, LEVOTHROID) 88 MCG tablet    rosuvastatin (CRESTOR) 20 MG tablet Take 1 tablet (20 mg total) by mouth daily.   [DISCONTINUED] rosuvastatin (CRESTOR) 10 MG tablet Take 10 mg by mouth daily.     Allergies:   Compazine  [prochlorperazine], Penicillins, and Prochlorperazine edisylate   Social History   Socioeconomic History   Marital status: Married    Spouse name: Not on file   Number of children: Not on file   Years of education: Not on file   Highest education level: Not on file  Occupational History   Not on file  Tobacco Use   Smoking status: Never   Smokeless tobacco: Never  Vaping Use   Vaping Use: Never used  Substance and Sexual Activity   Alcohol use: Yes    Comment: occasional wine - 2-3 x month   Drug use: Never   Sexual activity: Yes  Other Topics Concern   Not on file  Social History Narrative   Not on file  Social Determinants of Health   Financial Resource Strain: Not on file  Food Insecurity: Not on file  Transportation Needs: Not on file  Physical Activity: Not on file  Stress: Not on file  Social Connections: Not on file     Family History: The patient's family history is negative for Colon cancer, Rectal cancer, and Stomach cancer.  ROS:   Please see the history of present illness.     All other systems reviewed and are negative.  EKGs/Labs/Other Studies Reviewed:    The following studies were reviewed today: Coronary calcium score personally reviewed with him, showed him the images.  EKG:  EKG is  ordered today.  The ekg ordered today demonstrates sinus rhythm 82  Recent Labs: No results found for requested labs within last 8760  hours.  Recent Lipid Panel No results found for: CHOL, TRIG, HDL, CHOLHDL, VLDL, LDLCALC, LDLDIRECT   Risk Assessment/Calculations:          Physical Exam:    VS:  BP 112/70 (BP Location: Left Arm, Patient Position: Sitting, Cuff Size: Normal)   Pulse 82   Ht 6\' 5"  (1.956 m)   Wt 175 lb (79.4 kg)   SpO2 98%   BMI 20.75 kg/m     Wt Readings from Last 3 Encounters:  09/20/20 175 lb (79.4 kg)  08/16/20 170 lb (77.1 kg)  05/29/19 179 lb 12.8 oz (81.6 kg)     GEN:  Well nourished, well developed in no acute distress tall, thin HEENT: Normal NECK: No JVD; soft left carotid bruit LYMPHATICS: No lymphadenopathy CARDIAC: RRR, soft systolic murmur, no rubs, gallops RESPIRATORY:  Clear to auscultation without rales, wheezing or rhonchi  ABDOMEN: Soft, non-tender, non-distended MUSCULOSKELETAL:  No edema; No deformity  SKIN: Warm and dry NEUROLOGIC:  Alert and oriented x 3 PSYCHIATRIC:  Normal affect   ASSESSMENT:    1. Shortness of breath   2. Elevated coronary artery calcium score   3. Pre-procedure lab exam    PLAN:    In order of problems listed above:  Coronary artery disease/abnormal cardiac CT/dyspnea/chest discomfort - Given his 99th percentile coronary artery score and symptoms albeit periodic, we will proceed with cardiac catheterization with possible percutaneous intervention.  Risks and benefits including stroke heart attack death renal impairment bleeding have been discussed.  He is willing to proceed.  Next Thursday, July 21.  Dr. Tamala Julian. -I would like to make sure that we get a firm diagnosis for any luminal stenosis with angiogram. -We have increased his Crestor from 10 mg up to 20 mg a day.  He is tolerating the 10 mg well no myalgias.  High intensity statin.  We have advocated for aspirin 81 mg a day.  Watch for any signs of bleeding.   Prior history of Hodgkin's lymphoma - Extensive chest wall radiation performed in his late teens.  He also had MOP  therapy.  There is no evidence of pericardial calcification seen on CT scan.  Reassuring given his dyspnea.  No evidence of pericardial constriction.   Aortic valve sclerosis/dyspnea - We will check an echocardiogram to ensure proper structure and function and valvular morphology.  Family history of CAD - On his mother side, his mother's father and 2 brothers.  One had percutaneous interventions, one had bypass.  He states that they did grow up in [place], perhaps dietary factors played a role.  Continue with aggressive risk factor modification.  Shared Decision Making/Informed Consent The risks [stroke (1 in 1000), death (1  in 1000), kidney failure [usually temporary] (1 in 500), bleeding (1 in 200), allergic reaction [possibly serious] (1 in 200)], benefits (diagnostic support and management of coronary artery disease) and alternatives of a cardiac catheterization were discussed in detail with Ian Goodman and he is willing to proceed.    Medication Adjustments/Labs and Tests Ordered: Current medicines are reviewed at length with the patient today.  Concerns regarding medicines are outlined above.  Orders Placed This Encounter  Procedures   CBC   Basic metabolic panel   EKG 96-EAVW   ECHOCARDIOGRAM COMPLETE   Meds ordered this encounter  Medications   rosuvastatin (CRESTOR) 20 MG tablet    Sig: Take 1 tablet (20 mg total) by mouth daily.    Dispense:  90 tablet    Refill:  3   aspirin EC 81 MG tablet    Sig: Take 1 tablet (81 mg total) by mouth daily. Swallow whole.    Dispense:  90 tablet    Refill:  3    Patient Instructions  Medication Instructions Please increase your Crestor to 20 mg a day. Start Aspirin 81 mg a day. Continue all other medications as listed.  *If you need a refill on your cardiac medications before your next appointment, please call your pharmacy*  Lab Work: Please have blood work today.   (CBC, BMP) If you have labs (blood work) drawn today and  your tests are completely normal, you will receive your results only by: [place] (if you have MyChart) OR A paper copy in the mail If you have any lab test that is abnormal or we need to change your treatment, we will call you to review the results.  Testing/Procedures: Your physician has requested that you have an echocardiogram. Echocardiography is a painless test that uses sound waves to create images of your heart. It provides your doctor with information about the size and shape of your heart and how well your heart's chambers and valves are working. This procedure takes approximately one hour. There are no restrictions for this procedure.    [place] OFFICE [place], [place] [address] Dept: [phone] Loc: [phone]  Isley Zinni  09/20/2020  You are scheduled for a Cardiac Catheterization on Thursday, July 21 with Dr. Daneen Schick.  1. Please arrive at the [facility] (Main Entrance A) at [facility]: [place], [place] [address] at 7:00 AM (two hours before your procedure to ensure your preparation). Free valet parking service is available.   Special note: Every effort is made to have your procedure done on time. Please understand that emergencies sometimes delay scheduled procedures.  2. Diet: Do not eat or drink anything after midnight prior to your procedure except sips of water to take medications.  3. Labs:  Today 7/12 (CBC, BMP)  4. Medication instructions in preparation for your procedure:  On the morning of your procedure, take your Aspirin and any morning medicines NOT listed above.  You may use sips of water.  5. Plan for one night stay--bring personal belongings. 6. Bring a current list of your medications and current insurance cards. 7. You MUST have a responsible person to drive you home. 8. Someone MUST be with you the first  24 hours after you arrive home or your discharge will be delayed. 9. Please wear clothes that are easy to get on and off and wear slip-on shoes.  Thank you  for allowing [place] to care for you!   -- [place] [facility] services  Follow-Up: At [facility], you and your health needs are our priority.  As part of our continuing mission to provide you with exceptional heart care, we have created designated Provider Care Teams.  These Care Teams include your primary Cardiologist (physician) and Advanced Practice Providers (APPs -  Physician Assistants and Nurse Practitioners) who all work together to provide you with the care you need, when you need it.  We recommend signing up for the patient portal called "MyChart".  Sign up information is provided on this After Visit Summary.  MyChart is used to connect with patients for Virtual Visits (Telemedicine).  Patients are able to view lab/test results, encounter notes, upcoming appointments, etc.  Non-urgent messages can be sent to your provider as well.   To learn more about what you can do with MyChart, go to [email or web address].    Your next appointment:   3 month(s)  The format for your next appointment:   In Person  Provider:   Candee Furbish, MD   Thank you for choosing [facility]!!     Signed, Candee Furbish, MD  09/20/2020 10:39 AM    [place]

## 2020-09-28 ENCOUNTER — Telehealth: Payer: Self-pay | Admitting: *Deleted

## 2020-09-28 NOTE — H&P (Signed)
High risk coronary Calcium score. Atypical CP with DOE. Positive family history.

## 2020-09-28 NOTE — Telephone Encounter (Addendum)
Pt contacted pre-catheterization scheduled at [facility] for: Thursday September 29, 2020 9 AM Verified arrival time and place: [facility]) at: 7 AM   No solid food after midnight prior to cath, clear liquids until 5 AM day of procedure.   AM meds can be  taken pre-cath with sips of water including: aspirin 81 mg   Confirmed patient has responsible adult to drive home post procedure and be with patient first 24 hours after arriving home: yes  You are allowed ONE visitor in the waiting room during the time you are at the hospital for your procedure. Both you and your visitor must wear a mask once you enter the hospital.   Patient reports does not currently have any symptoms concerning for COVID-19 and no household members with COVID-19 like illness.       Reviewed procedure/mask/visitor instructions with patient.

## 2020-09-29 ENCOUNTER — Ambulatory Visit (HOSPITAL_COMMUNITY)
Admission: RE | Admit: 2020-09-29 | Discharge: 2020-09-29 | Disposition: A | Discharge status: Home / Self Care | Payer: BC Managed Care – PPO | Attending: Interventional Cardiology | Admitting: Interventional Cardiology

## 2020-09-29 ENCOUNTER — Other Ambulatory Visit: Payer: Self-pay

## 2020-09-29 ENCOUNTER — Ambulatory Visit (HOSPITAL_COMMUNITY)
Admission: RE | Disposition: A | Discharge status: Home / Self Care | Payer: Self-pay | Source: Home / Self Care | Attending: Interventional Cardiology

## 2020-09-29 DIAGNOSIS — Z79890 Hormone replacement therapy (postmenopausal): Secondary | ICD-10-CM | POA: Diagnosis not present

## 2020-09-29 DIAGNOSIS — Z880 Allergy status to penicillin: Secondary | ICD-10-CM | POA: Diagnosis not present

## 2020-09-29 DIAGNOSIS — R079 Chest pain, unspecified: Secondary | ICD-10-CM

## 2020-09-29 DIAGNOSIS — Z888 Allergy status to other drugs, medicaments and biological substances status: Secondary | ICD-10-CM | POA: Diagnosis not present

## 2020-09-29 DIAGNOSIS — C8190 Hodgkin lymphoma, unspecified, unspecified site: Secondary | ICD-10-CM | POA: Diagnosis present

## 2020-09-29 DIAGNOSIS — Z8249 Family history of ischemic heart disease and other diseases of the circulatory system: Secondary | ICD-10-CM | POA: Insufficient documentation

## 2020-09-29 DIAGNOSIS — R0602 Shortness of breath: Secondary | ICD-10-CM | POA: Diagnosis present

## 2020-09-29 DIAGNOSIS — I2510 Atherosclerotic heart disease of native coronary artery without angina pectoris: Secondary | ICD-10-CM

## 2020-09-29 DIAGNOSIS — Z79899 Other long term (current) drug therapy: Secondary | ICD-10-CM | POA: Insufficient documentation

## 2020-09-29 DIAGNOSIS — Z8571 Personal history of Hodgkin lymphoma: Secondary | ICD-10-CM | POA: Insufficient documentation

## 2020-09-29 DIAGNOSIS — Z7982 Long term (current) use of aspirin: Secondary | ICD-10-CM | POA: Insufficient documentation

## 2020-09-29 HISTORY — PX: LEFT HEART CATH AND CORONARY ANGIOGRAPHY: CATH118249

## 2020-09-29 HISTORY — PX: CORONARY PRESSURE WIRE/FFR WITH 3D MAPPING: CATH118309

## 2020-09-29 LAB — POCT ACTIVATED CLOTTING TIME: Activated Clotting Time: 312 seconds

## 2020-09-29 SURGERY — LEFT HEART CATH AND CORONARY ANGIOGRAPHY
Anesthesia: LOCAL

## 2020-09-29 MED ORDER — FENTANYL CITRATE (PF) 100 MCG/2ML IJ SOLN
INTRAMUSCULAR | Status: AC
  Filled 2020-09-29: qty 2

## 2020-09-29 MED ORDER — SODIUM CHLORIDE 0.9 % IV SOLN: 250.0000 mL | INTRAVENOUS | Status: DC | PRN

## 2020-09-29 MED ORDER — NITROGLYCERIN 1 MG/10 ML FOR IR/CATH LAB
INTRA_ARTERIAL | Status: DC | PRN
  Administered 2020-09-29: 200 ug via INTRACORONARY

## 2020-09-29 MED ORDER — SODIUM CHLORIDE 0.9% FLUSH: 3.0000 mL | Freq: Two times a day (BID) | INTRAVENOUS | Status: DC

## 2020-09-29 MED ORDER — SODIUM CHLORIDE 0.9 % WEIGHT BASED INFUSION
3.0000 mL/kg/h | INTRAVENOUS | Status: AC
  Administered 2020-09-29: 3 mL/kg/h via INTRAVENOUS

## 2020-09-29 MED ORDER — MIDAZOLAM HCL 2 MG/2ML IJ SOLN
INTRAMUSCULAR | Status: AC
  Filled 2020-09-29: qty 2

## 2020-09-29 MED ORDER — HEPARIN (PORCINE) IN NACL 1000-0.9 UT/500ML-% IV SOLN
INTRAVENOUS | Status: AC
  Filled 2020-09-29: qty 1000

## 2020-09-29 MED ORDER — HEPARIN SODIUM (PORCINE) 1000 UNIT/ML IJ SOLN
INTRAMUSCULAR | Status: AC
  Filled 2020-09-29: qty 1

## 2020-09-29 MED ORDER — SODIUM CHLORIDE 0.9% FLUSH: 3.0000 mL | INTRAVENOUS | Status: DC | PRN

## 2020-09-29 MED ORDER — LIDOCAINE HCL (PF) 1 % IJ SOLN
INTRAMUSCULAR | Status: AC
  Filled 2020-09-29: qty 30

## 2020-09-29 MED ORDER — LIDOCAINE HCL (PF) 1 % IJ SOLN
INTRAMUSCULAR | Status: DC | PRN
  Administered 2020-09-29: 2 mL

## 2020-09-29 MED ORDER — SODIUM CHLORIDE 0.9 % WEIGHT BASED INFUSION: 1.0000 mL/kg/h | INTRAVENOUS | Status: DC

## 2020-09-29 MED ORDER — HEPARIN SODIUM (PORCINE) 1000 UNIT/ML IJ SOLN
INTRAMUSCULAR | Status: DC | PRN
  Administered 2020-09-29 (×2): 4000 [IU] via INTRAVENOUS

## 2020-09-29 MED ORDER — FENTANYL CITRATE (PF) 100 MCG/2ML IJ SOLN
INTRAMUSCULAR | Status: DC | PRN
  Administered 2020-09-29 (×3): 25 ug via INTRAVENOUS

## 2020-09-29 MED ORDER — SODIUM CHLORIDE 0.9 % IV SOLN: INTRAVENOUS | Status: DC

## 2020-09-29 MED ORDER — HYDRALAZINE HCL 20 MG/ML IJ SOLN: 10.0000 mg | INTRAMUSCULAR | Status: DC | PRN

## 2020-09-29 MED ORDER — ONDANSETRON HCL 4 MG/2ML IJ SOLN: 4.0000 mg | Freq: Four times a day (QID) | INTRAMUSCULAR | Status: DC | PRN

## 2020-09-29 MED ORDER — IOHEXOL 350 MG/ML SOLN
INTRAVENOUS | Status: DC | PRN
  Administered 2020-09-29: 100 mL

## 2020-09-29 MED ORDER — NITROGLYCERIN 1 MG/10 ML FOR IR/CATH LAB
INTRA_ARTERIAL | Status: AC
  Filled 2020-09-29: qty 10

## 2020-09-29 MED ORDER — ASPIRIN 81 MG PO CHEW: 81.0000 mg | CHEWABLE_TABLET | ORAL | Status: DC

## 2020-09-29 MED ORDER — OXYCODONE HCL 5 MG PO TABS: 5.0000 mg | ORAL_TABLET | ORAL | Status: DC | PRN

## 2020-09-29 MED ORDER — HEPARIN (PORCINE) IN NACL 1000-0.9 UT/500ML-% IV SOLN
INTRAVENOUS | Status: DC | PRN
  Administered 2020-09-29 (×2): 500 mL

## 2020-09-29 MED ORDER — ASPIRIN 81 MG PO CHEW: 81.0000 mg | CHEWABLE_TABLET | Freq: Every day | ORAL | Status: DC

## 2020-09-29 MED ORDER — ADENOSINE (DIAGNOSTIC) 140MCG/KG/MIN
INTRAVENOUS | Status: DC | PRN
  Administered 2020-09-29: 140 ug/kg/min via INTRAVENOUS

## 2020-09-29 MED ORDER — ADENOSINE 12 MG/4ML IV SOLN
INTRAVENOUS | Status: AC
  Filled 2020-09-29: qty 16

## 2020-09-29 MED ORDER — MIDAZOLAM HCL 2 MG/2ML IJ SOLN
INTRAMUSCULAR | Status: DC | PRN
  Administered 2020-09-29: 1 mg via INTRAVENOUS
  Administered 2020-09-29: 0.5 mg via INTRAVENOUS
  Administered 2020-09-29: 1 mg via INTRAVENOUS

## 2020-09-29 MED ORDER — VERAPAMIL HCL 2.5 MG/ML IV SOLN
INTRAVENOUS | Status: AC
  Filled 2020-09-29: qty 2

## 2020-09-29 MED ORDER — VERAPAMIL HCL 2.5 MG/ML IV SOLN
INTRAVENOUS | Status: DC | PRN
  Administered 2020-09-29: 10 mL via INTRA_ARTERIAL

## 2020-09-29 MED ORDER — ADENOSINE 12 MG/4ML IV SOLN
INTRAVENOUS | Status: AC
  Filled 2020-09-29: qty 4

## 2020-09-29 MED ORDER — LABETALOL HCL 5 MG/ML IV SOLN: 10.0000 mg | INTRAVENOUS | Status: DC | PRN

## 2020-09-29 MED ORDER — ACETAMINOPHEN 325 MG PO TABS: 650.0000 mg | ORAL_TABLET | ORAL | Status: DC | PRN

## 2020-09-29 SURGICAL SUPPLY — 13 items
CATH 5FR JL3.5 JR4 ANG PIG MP (CATHETERS) ×2 IMPLANT
CATH LAUNCHER 6FR EBU3.5 (CATHETERS) ×2 IMPLANT
DEVICE RAD COMP TR BAND LRG (VASCULAR PRODUCTS) ×2 IMPLANT
GLIDESHEATH SLEND A-KIT 6F 22G (SHEATH) ×2 IMPLANT
GUIDEWIRE INQWIRE 1.5J.035X260 (WIRE) ×1 IMPLANT
GUIDEWIRE PRESSURE X 175 (WIRE) ×2 IMPLANT
INQWIRE 1.5J .035X260CM (WIRE) ×2
KIT ESSENTIALS PG (KITS) ×2 IMPLANT
KIT HEART LEFT (KITS) ×2 IMPLANT
PACK CARDIAC CATHETERIZATION (CUSTOM PROCEDURE TRAY) ×2 IMPLANT
SHEATH PROBE COVER 6X72 (BAG) ×2 IMPLANT
TRANSDUCER W/STOPCOCK (MISCELLANEOUS) ×2 IMPLANT
TUBING CIL FLEX 10 FLL-RA (TUBING) ×2 IMPLANT

## 2020-09-29 NOTE — CV Procedure (Signed)
[place] 0, 1, 0 proximal LAD bifurcation stenosis 75%.  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 Normal LV systolic function and EDP.  Preventive therapy, anti-ischemic therapy, and revascularization if limiting symptoms.  Conservative management for the time being and would recommend limiting symptoms drive further invasive management.  Consider surgical versus stent based options.  Ostial disease and bifurcation disease have greater risk of downstream stent failure.  Arterial grafting is appealing however chest radiation with scarring would make takedown of internal mammary arteries difficult.  Explore a surgical evaluation to learn what our options are.

## 2020-09-29 NOTE — Research (Signed)
Identify Informed Consent   Subject Name: Ian Goodman  Subject met inclusion and exclusion criteria.  The informed consent form, study requirements and expectations were reviewed with the subject and questions and concerns were addressed prior to the signing of the consent form.  The subject verbalized understanding of the trial requirements.  The subject agreed to participate in the Identify trial and signed the informed consent at [place] on 09/29/20.  The informed consent was obtained prior to performance of any protocol-specific procedures for the subject.  A copy of the signed informed consent was given to the subject and a copy was placed in the subject's medical record.   Eloisa Chokshi

## 2020-09-29 NOTE — Interval H&P Note (Signed)
Cath Lab Visit (complete for each Cath Lab visit)  Clinical Evaluation Leading to the Procedure:   ACS: No.  Non-ACS:    Anginal Classification: CCS II  Anti-ischemic medical therapy: No Therapy  Non-Invasive Test Results: No non-invasive testing performed  Prior CABG: No previous CABG      History and Physical Interval Note:  09/29/2020 10:01 AM  Ian Goodman  has presented today for surgery, with the diagnosis of shortness of breath.  The various methods of treatment have been discussed with the patient and family. After consideration of risks, benefits and other options for treatment, the patient has consented to  Procedure(s): LEFT HEART CATH AND CORONARY ANGIOGRAPHY (N/A) as a surgical intervention.  The patient's history has been reviewed, patient examined, no change in status, stable for surgery.  I have reviewed the patient's chart and labs.  Questions were answered to the patient's satisfaction.     Belva Crome III

## 2020-09-30 ENCOUNTER — Encounter (HOSPITAL_COMMUNITY): Payer: Self-pay | Admitting: Interventional Cardiology

## 2020-09-30 MED FILL — Adenosine IV Soln 12 MG/4ML: INTRAVENOUS | Qty: 4 | Status: AC

## 2020-10-03 ENCOUNTER — Telehealth: Payer: Self-pay | Admitting: Cardiology

## 2020-10-03 DIAGNOSIS — I25119 Atherosclerotic heart disease of native coronary artery with unspecified angina pectoris: Secondary | ICD-10-CM

## 2020-10-03 DIAGNOSIS — R079 Chest pain, unspecified: Secondary | ICD-10-CM

## 2020-10-03 DIAGNOSIS — R0600 Dyspnea, unspecified: Secondary | ICD-10-CM

## 2020-10-03 DIAGNOSIS — R0989 Other specified symptoms and signs involving the circulatory and respiratory systems: Secondary | ICD-10-CM

## 2020-10-04 ENCOUNTER — Other Ambulatory Visit: Payer: Self-pay

## 2020-10-04 ENCOUNTER — Ambulatory Visit (INDEPENDENT_AMBULATORY_CARE_PROVIDER_SITE_OTHER): Payer: BC Managed Care – PPO | Admitting: Sports Medicine

## 2020-10-04 ENCOUNTER — Encounter: Payer: Self-pay | Admitting: *Deleted

## 2020-10-04 DIAGNOSIS — S43432S Superior glenoid labrum lesion of left shoulder, sequela: Secondary | ICD-10-CM

## 2020-10-04 DIAGNOSIS — M25512 Pain in left shoulder: Secondary | ICD-10-CM

## 2020-10-04 NOTE — Telephone Encounter (Signed)
Spoke with pt and reviewed recommendations and instructions for the stress test.  Pt verbalized understanding.  Pt scheduled for testing next week,

## 2020-10-04 NOTE — Telephone Encounter (Signed)
   Please order the following:  Carotid Dopplers - dx bruit  Exercise NUC stress test (want him to go on treadmill for stress portion) - dx CAD, chest pain, dyspnea. To evaluate burden of ischemia.   Thanks  Candee Furbish, MD

## 2020-10-04 NOTE — Progress Notes (Addendum)
PCP: Ginger Organ., MD  Subjective:   HPI: Patient is a 50 y.o. male here for follow-up of left shoulder pain.  About 13 months ago patient had an AC joint and SLAP repair.  He had some setbacks with his recovery and physical therapy.  During his last visit, he was given free weight exercises and stretches to improve his scapular winging and strengthen his shoulder stability.  He states these have significantly helped his range of motion and lessen his pain.  He did try taking the nitro patches, however this caused headache, nausea and dizziness so he has discontinued these.  He has been pleased with his recovery thus far, however he did have a heart cath performed just under a week ago and his home exercises have been halted since this time, which has led to some stiffening of the shoulder joint.  Past Medical History:  Diagnosis Date   Adenomatous polyp of colon    Allergy    Cancer ([place])    hodgins lymphona   Cardiac murmur    Dyspnea on exertion    Elevated coronary artery calcium score    H/O splenectomy    Headache    Hematuria, microscopic    Hodgkin lymphoma (HCC)    Hyperlipidemia    Insomnia    PSA (psoriatic arthritis) ([place])    Restrictive lung disease    Thyroid disease    Past Hx ; CA calcifications and narrowing thought 2/2 radiation for Hodgkins.    BP 112/70   Ht '6\' 5"'$  (1.956 m)   Wt 175 lb (79.4 kg)   BMI 20.75 kg/m   No flowsheet data found.  No flowsheet data found.  Review of Systems: See HPI above.     Objective:  Physical Exam:  Gen: Well-appearing, in no acute distress; non-toxic CV: Regular Rate. Well-perfused. Warm.  Resp: Breathing unlabored on room air; no wheezing. Psych: Fluid speech in conversation; appropriate affect; normal thought process Neuro: Sensation intact throughout. No gross coordination deficits.  MSK: Mild ecchymosis over distal right wrist. Shoulder, left: No erythema, or ecchymosis noted.  There is some TTP over  the [facility] joint without any warmth or effusion palpated. No tenderness over long head of biceps (bicipital groove). There is some restriction in Extension of the left shoulder to approx 45 degrees. Otherwise Full active and passive range of motion in all other directions, albeit mild pain with end-range internal rotation. , Thumb to T12 without significant tenderness b/l. Strength 5/5 throughout. There is mild scapular winging with extension and retraction of the left shoulder; no abnormal winging with wall push, flexion, or other motions of the shoulder.  Sensation to light touch intact. Peripheral pulses intact.   Special Tests:   - Painful Arc absent   - Empty can: NEG   - Int/Ext Rotation test: NEG   Dory Horn Lift-Off Test: NEG   - Cross arm adduction: + mildly positive   - Speeds test: NEG     Assessment & Plan:  1. Slap Lesion Sequelae  Continue to work on HEP and steady progress  I observed and examined the patient with the [facility] resident and agree with assessment and plan.  Note reviewed and modified by me. Ila Mcgill, MD

## 2020-10-04 NOTE — Assessment & Plan Note (Signed)
He shows good improvement on exam today This is with only 6 weeks of HEP interrupted by cardiac cath  He can continue the HEP over the next 2 months If he makes steady progress we will not need F/U  If issues persist we can consider a course of PT

## 2020-10-04 NOTE — Telephone Encounter (Signed)
Left message to call back  

## 2020-10-05 ENCOUNTER — Telehealth (HOSPITAL_COMMUNITY): Payer: Self-pay | Admitting: *Deleted

## 2020-10-05 NOTE — Telephone Encounter (Signed)
Attempted to call patient regarding upcoming appointment- no answer, unable to leave a message.  Letter sent via mychart.  Ian Goodman

## 2020-10-10 ENCOUNTER — Ambulatory Visit (HOSPITAL_COMMUNITY)
Admission: RE | Admit: 2020-10-10 | Discharge: 2020-10-10 | Disposition: A | Discharge status: Home / Self Care | Payer: BC Managed Care – PPO | Source: Ambulatory Visit | Attending: Cardiology | Admitting: Cardiology

## 2020-10-10 ENCOUNTER — Ambulatory Visit (HOSPITAL_BASED_OUTPATIENT_CLINIC_OR_DEPARTMENT_OTHER): Payer: BC Managed Care – PPO

## 2020-10-10 ENCOUNTER — Other Ambulatory Visit: Payer: Self-pay

## 2020-10-10 DIAGNOSIS — R931 Abnormal findings on diagnostic imaging of heart and coronary circulation: Secondary | ICD-10-CM | POA: Insufficient documentation

## 2020-10-10 DIAGNOSIS — R0602 Shortness of breath: Secondary | ICD-10-CM

## 2020-10-10 DIAGNOSIS — R0989 Other specified symptoms and signs involving the circulatory and respiratory systems: Secondary | ICD-10-CM | POA: Diagnosis not present

## 2020-10-10 LAB — ECHOCARDIOGRAM COMPLETE
Area-P 1/2: 2 cm2
S' Lateral: 2.4 cm

## 2020-10-10 MED ORDER — PERFLUTREN LIPID MICROSPHERE
1.0000 mL | INTRAVENOUS | Status: AC | PRN
Start: 2020-10-10 — End: 2020-10-10
  Administered 2020-10-10: 2 mL via INTRAVENOUS

## 2020-10-12 ENCOUNTER — Other Ambulatory Visit: Payer: Self-pay

## 2020-10-12 ENCOUNTER — Ambulatory Visit (HOSPITAL_COMMUNITY): Payer: BC Managed Care – PPO | Attending: Cardiology

## 2020-10-12 DIAGNOSIS — I25119 Atherosclerotic heart disease of native coronary artery with unspecified angina pectoris: Secondary | ICD-10-CM | POA: Diagnosis not present

## 2020-10-12 DIAGNOSIS — R079 Chest pain, unspecified: Secondary | ICD-10-CM | POA: Diagnosis not present

## 2020-10-12 DIAGNOSIS — R0600 Dyspnea, unspecified: Secondary | ICD-10-CM | POA: Diagnosis not present

## 2020-10-12 LAB — MYOCARDIAL PERFUSION IMAGING
Estimated workload: 9.7 METS
Exercise duration (min): 7 min
Exercise duration (sec): 46 s
LV dias vol: 70 mL (ref 62–150)
LV sys vol: 20 mL
MPHR: 168 {beats}/min
Peak HR: 157 {beats}/min
Percent HR: 93 %
Rest HR: 93 {beats}/min
SDS: 0
SRS: 0
SSS: 0
TID: 0.98

## 2020-10-12 MED ORDER — TECHNETIUM TC 99M TETROFOSMIN IV KIT
30.7000 | PACK | Freq: Once | INTRAVENOUS | Status: AC | PRN
  Administered 2020-10-12: 30.7 via INTRAVENOUS
  Filled 2020-10-12: qty 31

## 2020-10-12 MED ORDER — TECHNETIUM TC 99M TETROFOSMIN IV KIT
10.9000 | PACK | Freq: Once | INTRAVENOUS | Status: AC | PRN
  Administered 2020-10-12: 10.9 via INTRAVENOUS
  Filled 2020-10-12: qty 11

## 2020-10-24 ENCOUNTER — Telehealth: Payer: Self-pay | Admitting: Cardiology

## 2020-10-24 DIAGNOSIS — I9789 Other postprocedural complications and disorders of the circulatory system, not elsewhere classified: Secondary | ICD-10-CM

## 2020-10-24 NOTE — Telephone Encounter (Signed)
Order placed for stat right artery upper extremity ultrasound

## 2020-10-24 NOTE — Telephone Encounter (Signed)
   Please order a right radial artery ultrasound at [facility] office to look for pseudoaneurysm and to possibly treat with compression. He has a ~1 cm pulsatile bulge at cath site.   Thanks Candee Furbish, MD

## 2020-10-27 NOTE — Telephone Encounter (Signed)
Patient returned call please call back today

## 2020-10-28 NOTE — Telephone Encounter (Signed)
Patient was offered appointments on 8/18 and 8/19, but was not available to come in until 11/01/20.  We have called and left a message that he should be done sooner, per the doctor's request.

## 2020-11-01 ENCOUNTER — Other Ambulatory Visit: Payer: Self-pay

## 2020-11-01 ENCOUNTER — Ambulatory Visit (HOSPITAL_COMMUNITY)
Admission: RE | Admit: 2020-11-01 | Discharge: 2020-11-01 | Disposition: A | Discharge status: Home / Self Care | Payer: BC Managed Care – PPO | Source: Ambulatory Visit | Attending: Cardiovascular Disease | Admitting: Cardiovascular Disease

## 2020-11-01 DIAGNOSIS — I9789 Other postprocedural complications and disorders of the circulatory system, not elsewhere classified: Secondary | ICD-10-CM | POA: Diagnosis not present

## 2020-11-01 DIAGNOSIS — I97630 Postprocedural hematoma of a circulatory system organ or structure following a cardiac catheterization: Secondary | ICD-10-CM

## 2021-01-24 DIAGNOSIS — I255 Ischemic cardiomyopathy: Secondary | ICD-10-CM | POA: Insufficient documentation

## 2021-02-27 DIAGNOSIS — T82897A Other specified complication of cardiac prosthetic devices, implants and grafts, initial encounter: Secondary | ICD-10-CM | POA: Insufficient documentation

## 2021-03-24 ENCOUNTER — Other Ambulatory Visit: Payer: Self-pay

## 2021-03-24 ENCOUNTER — Ambulatory Visit (INDEPENDENT_AMBULATORY_CARE_PROVIDER_SITE_OTHER): Payer: BC Managed Care – PPO | Admitting: Cardiology

## 2021-03-24 ENCOUNTER — Encounter: Payer: Self-pay | Admitting: Cardiology

## 2021-03-24 DIAGNOSIS — I358 Other nonrheumatic aortic valve disorders: Secondary | ICD-10-CM | POA: Diagnosis not present

## 2021-03-24 DIAGNOSIS — I2510 Atherosclerotic heart disease of native coronary artery without angina pectoris: Secondary | ICD-10-CM

## 2021-03-24 NOTE — Progress Notes (Signed)
Cardiology Office Note:    Date:  03/24/2021   ID:  Ian Goodman, DOB Oct 27, 1970, MRN [id number]  PCP:  Ginger Organ., MD   [facility] Providers Cardiologist:  None     Referring MD: Ginger Organ., MD    History of Present Illness:    Ian Goodman is a 51 y.o. 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  Seen at [facility] clinic, Dr. Shearon Stalls, Dr.Khatri (interventional cardiology).  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  No evidence of pericardial calcification.  His cardiac evaluation began after coronary calcium score was 754, 99th percentile.  Main symptom over the past several years has been dyspnea on exertion.  Somewhat progressive.  He is noted post PCI recently that he has had some dull chest discomfort fairly constant constant right of sternum pinpoint.  Past Medical History:  Diagnosis Date   Adenomatous polyp of colon    Allergy    [place]   Cardiac murmur    Dyspnea on exertion    Elevated coronary artery calcium score    H/O splenectomy    Headache    Hematuria,  microscopic    Hodgkin lymphoma (HCC)    Hyperlipidemia    Insomnia    PSA (psoriatic arthritis) (HCC) [place] disease    Thyroid disease     Past Surgical History:  Procedure Laterality Date   ABDOMINAL EXPLORATION SURGERY     spleen removed   CORONARY PRESSURE WIRE/FFR WITH 3D MAPPING N/A 09/29/2020   Procedure: Coronary Pressure Wire/FFR w/3D Mapping;  Surgeon: SmithBelva Crome  [facility];  Service: Cardiovascular;  Laterality: N/A;   LEFT HEART CATH AND CORONARY ANGIOGRAPHY N/A 09/29/2020   Procedure: LEFT HEART CATH AND CORONARY ANGIOGRAPHY;  Surgeon: SmithBelva Crome  [facility];  Service: Cardiovascular;  Laterality: N/A;   UPPER GASTROINTESTINAL ENDOSCOPY     ucler - 15 yrs ago    Current Medications: Current Meds  Medication Sig   aspirin EC 81 MG tablet Take 1 tablet (81 mg total) by mouth daily. Swallow whole.   clopidogrel (PLAVIX) 75 MG tablet Take 1 tablet by mouth daily.   levothyroxine (SYNTHROID, LEVOTHROID) 88 MCG tablet Take 88 mcg by mouth daily.     Allergies:   Compazine  [prochlorperazine], Penicillins, and Prochlorperazine edisylate   Social History   Socioeconomic History   Marital status: Married    Spouse name: Not on file   Number of children: Not on file   Years of education: Not on file   Highest  education level: Not on file  Occupational History   Not on file  Tobacco Use   Smoking status: Never   Smokeless tobacco: Never  Vaping Use   Vaping Use: Never used  Substance and Sexual Activity   Alcohol use: Yes    Comment: occasional wine - 2-3 x month   Drug use: Never   Sexual activity: Yes  Other Topics Concern   Not on file  Social History Narrative   Not on file   Social Determinants of Health   Financial Resource Strain: Not on file  Food Insecurity: Not on file  Transportation Needs: Not on file  Physical Activity: Not on file  Stress: Not on file  Social Connections:  Not on file     Family History: The patient's family history is negative for Colon cancer, Rectal cancer, and Stomach cancer.  ROS:   Please see the history of present illness.     All other systems reviewed and are negative.  EKGs/Labs/Other Studies Reviewed:    The following studies were reviewed today: Catheterization reports from [facility] lab work  EKG:  EKG is  ordered today.  The ekg ordered today demonstrates sinus rhythm 78 no other abnormalities.  No change from prior EKG.  Recent Labs: 09/20/2020: BUN 22; Creatinine, Ser 0.92; Hemoglobin 14.4; Platelets 285; Potassium 5.3; Sodium 140  Recent Lipid Panel No results found for: CHOL, TRIG, HDL, CHOLHDL, VLDL, LDLCALC, LDLDIRECT   Risk Assessment/Calculations:              Physical Exam:    VS:  BP 126/70 (BP Location: Left Arm, Patient Position: Sitting, Cuff Size: Normal)    Pulse 78    Ht _0  (1.956 m)    Wt 176 lb (79.8 kg)    SpO2 98%    BMI 20.87 kg/m     Wt Readings from Last 3 Encounters:  03/24/21 176 lb (79.8 kg)  10/12/20 175 lb (79.4 kg)  10/04/20 175 lb (79.4 kg)     GEN: Thin, tall in no acute distress HEENT: Normal NECK: No JVD; No carotid bruits LYMPHATICS: No lymphadenopathy CARDIAC: RRR, no murmurs, no rubs, gallops RESPIRATORY:  Clear to auscultation without rales, wheezing or rhonchi  ABDOMEN: Soft, non-tender, non-distended MUSCULOSKELETAL:  No edema; No deformity, catheterization sites intact.  Normal pulses SKIN: Warm and dry NEUROLOGIC:  Alert and oriented x 3 PSYCHIATRIC:  Normal affect   ASSESSMENT:    1. Coronary artery disease involving native coronary artery of native heart without angina pectoris   2. Aortic valve sclerosis    PLAN:    In order of problems listed above:  Radiation-induced coronary artery disease Radiation-induced coronary artery disease.  Appreciate Dr. Mauricio Po expertise as well as Dr. Shelly Coss from the [facility] clinic for collaborative  efforts.  Successful PCI as noted above.  RCA, LAD.  PTCA only to a small anomalous circumflex.  Overall he is doing quite well.  Still having some mild nagging atypical discomfort right of sternum.  Likely inflammatory/MSK.  EKG unremarkable.  Also has some mild discomfort of right third finger PIP joint.  OK to go ahead and start walking, gently increase exercise efforts.  At this point, he would like to stay off of the rosuvastatin.  Continue with dual antiplatelet therapy at least for 6 months.  After 6 months, either aspirin monotherapy or Plavix monotherapy.  Aortic sclerosis noted on echocardiogram personally reviewed, showed images.  No evidence of stenosis at this point.  Aortic valve  sclerosis Echo reviewed as above.  No stenosis.  Prior radiation   Several questions answered.       Medication Adjustments/Labs and Tests Ordered: Current medicines are reviewed at length with the patient today.  Concerns regarding medicines are outlined above.  Orders Placed This Encounter  Procedures   EKG 12-Lead   No orders of the defined types were placed in this encounter.   Patient Instructions  Medication Instructions:  The current medical regimen is effective;  continue present plan and medications.  *If you need a refill on your cardiac medications before your next appointment, please call your pharmacy*  Follow-Up: At [facility], you and your health needs are our priority.  As part of our continuing mission to provide you with exceptional heart care, we have created designated Provider Care Teams.  These Care Teams include your primary Cardiologist (physician) and Advanced Practice Providers (APPs -  Physician Assistants and Nurse Practitioners) who all work together to provide you with the care you need, when you need it.  We recommend signing up for the patient portal called "MyChart".  Sign up information is provided on this After Visit Summary.  MyChart is used to  connect with patients for Virtual Visits (Telemedicine).  Patients are able to view lab/test results, encounter notes, upcoming appointments, etc.  Non-urgent messages can be sent to your provider as well.   To learn more about what you can do with MyChart, go to [email or web address].    Your next appointment:   November 2023  Provider:   Dr Marlou Porch     Signed, Candee Furbish, MD  03/24/2021 1:18 PM    [place]

## 2021-03-24 NOTE — Assessment & Plan Note (Addendum)
Radiation-induced coronary artery disease.  Appreciate Dr. Mauricio Po expertise as well as Dr. Shelly Coss from the [facility] clinic for collaborative efforts.  Successful PCI as noted above.  RCA, LAD.  PTCA only to a small anomalous circumflex.  Overall he is doing quite well.  Still having some mild nagging atypical discomfort right of sternum.  Likely inflammatory/MSK.  EKG unremarkable.  Also has some mild discomfort of right third finger PIP joint.  OK to go ahead and start walking, gently increase exercise efforts.  At this point, he would like to stay off of the rosuvastatin.  Continue with dual antiplatelet therapy at least for 6 months.  After 6 months, either aspirin monotherapy or Plavix monotherapy.  Aortic sclerosis noted on echocardiogram personally reviewed, showed images.  No evidence of stenosis at this point.

## 2021-03-24 NOTE — Patient Instructions (Signed)
Medication Instructions:  The current medical regimen is effective;  continue present plan and medications.  *If you need a refill on your cardiac medications before your next appointment, please call your pharmacy*  Follow-Up: At [facility], you and your health needs are our priority.  As part of our continuing mission to provide you with exceptional heart care, we have created designated Provider Care Teams.  These Care Teams include your primary Cardiologist (physician) and Advanced Practice Providers (APPs -  Physician Assistants and Nurse Practitioners) who all work together to provide you with the care you need, when you need it.  We recommend signing up for the patient portal called "MyChart".  Sign up information is provided on this After Visit Summary.  MyChart is used to connect with patients for Virtual Visits (Telemedicine).  Patients are able to view lab/test results, encounter notes, upcoming appointments, etc.  Non-urgent messages can be sent to your provider as well.   To learn more about what you can do with MyChart, go to [email or web address].    Your next appointment:   November 2023  Provider:   Dr Marlou Porch

## 2021-03-24 NOTE — Assessment & Plan Note (Signed)
Echo reviewed as above.  No stenosis.  Prior radiation

## 2021-05-15 ENCOUNTER — Encounter: Payer: Self-pay | Admitting: Gastroenterology

## 2021-09-18 ENCOUNTER — Telehealth: Payer: Self-pay

## 2021-09-18 NOTE — Telephone Encounter (Signed)
Recall has been entered for colon in Oct as ordered

## 2021-09-18 NOTE — Telephone Encounter (Signed)
-----   Message from Milus Banister, MD sent at 09/18/2021 10:36 AM EDT ----- Can you put him in a reminder for recall colonoscopy this October, he'll be off his blood thinner around then.   Thanks

## 2021-12-07 ENCOUNTER — Encounter: Payer: Self-pay | Admitting: Gastroenterology

## 2022-04-16 ENCOUNTER — Encounter: Payer: Self-pay | Admitting: Cardiology

## 2022-04-16 ENCOUNTER — Ambulatory Visit: Payer: Managed Care, Other (non HMO) | Attending: Cardiology | Admitting: Cardiology

## 2022-04-16 VITALS — BP 132/60 | HR 82 | Ht 77.0 in | Wt 177.8 lb

## 2022-04-16 DIAGNOSIS — I358 Other nonrheumatic aortic valve disorders: Secondary | ICD-10-CM

## 2022-04-16 DIAGNOSIS — I2510 Atherosclerotic heart disease of native coronary artery without angina pectoris: Secondary | ICD-10-CM | POA: Diagnosis not present

## 2022-04-16 DIAGNOSIS — R079 Chest pain, unspecified: Secondary | ICD-10-CM | POA: Diagnosis not present

## 2022-04-16 NOTE — Patient Instructions (Signed)
Medication Instructions:  Your physician recommends that you continue on your current medications as directed. Please refer to the Current Medication list given to you today.  *If you need a refill on your cardiac medications before your next appointment, please call your pharmacy*   Lab Work: None. If you have labs (blood work) drawn today and your tests are completely normal, you will receive your results only by: [place] (if you have MyChart) OR A paper copy in the mail If you have any lab test that is abnormal or we need to change your treatment, we will call you to review the results.   Testing/Procedures: None.   Follow-Up: At [facility], you and your health needs are our priority.  As part of our continuing mission to provide you with exceptional heart care, we have created designated Provider Care Teams.  These Care Teams include your primary Cardiologist (physician) and Advanced Practice Providers (APPs -  Physician Assistants and Nurse Practitioners) who all work together to provide you with the care you need, when you need it.  We recommend signing up for the patient portal called "MyChart".  Sign up information is provided on this After Visit Summary.  MyChart is used to connect with patients for Virtual Visits (Telemedicine).  Patients are able to view lab/test results, encounter notes, upcoming appointments, etc.  Non-urgent messages can be sent to your provider as well.   To learn more about what you can do with MyChart, go to [email or web address].    Your next appointment:   1 year(s)  Provider:   Dr. Candee Furbish, MD

## 2022-04-16 NOTE — Progress Notes (Signed)
Cardiology Office Note:    Date:  04/16/2022   ID:  Ian Goodman, DOB [birth year], MRN [id number]  PCP:  Ginger Organ., MD   [facility] Providers Cardiologist:  None     Referring MD: Ginger Organ., MD    History of Present Illness:    Ian Goodman is a 52 y.o. 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  Seen at [facility] clinic, Dr. Shearon Stalls, Dr.Khatri (interventional cardiology).  Previously seen 07/31/21, Care Everywhere. 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  From Dr. 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 Shelly Coss:  Procedures Performed: - HD-IVUS guided PTCA of the proximal portion of anomalous LCx arising from prox RCA with a 2.5 x 4m Euphora at 12 atm - HD-IVUS-guided PCI of prox RCA including ostium with a 4.0 x 14mSynergy Megatron EES [post-dilated with a 4.20m74mC] - HD-IVUS-guided PCI of the prox to mid LAD with a 3.5 x 14m73mence Skypoint EES [post-dilated with a 3.720mm60mproximal to Dg]   [facility]  Procedures Performed  Successful placement 2 stents (one to the right coronary artery (RCA) and one to the left anterior descending artery (LAD) & ballooning of the anomalous left circumflex (LCx) artery - HD-IVUS guided PTCA of the proximal portion of anomalous LCx arising from prox  RCA with a 2.5 x 120mm 58mora at 12 atm - HD-IVUS-guided PCI of prox RCA including ostium with a 4.0 x 16mm S56mgy Megatron EES [post-dilated with a 4.20mm [place]]74mHD-IVUS-guided PCI of the prox to mid LAD with a 3.5 x 14mm Xie28mSkypoint EES [post-dilated with a 3.720mm [place] p39mmal to Dg]   ECHO Collected: 01/02/2021 3:02 PM (Final result)  Impression: CONCLUSIONS: - Technically difficult exam due to body habitus. - Exam indication: Hx of radiation heart disease - The left ventricle is normal in size. Left ventricular systolic function is  normal. EF = 65  5% (visual est.) - The right ventricle is normal in size. Right ventricular systolic function is  normal. - History of chest radiation. - Base of the aortomitral continuity is thickened and calcified. - Aortic valve sclerosis without significant stenosis. - Amorphous echogenicity seen in the IVC, likely artifact (view 95, 97, also seen  in the abdominal aorta).     Echocardiogram showed aortic sclerosis but no evidence of stenosis.  No evidence of pericardial calcification.  Murmur on exam.  His cardiac evaluation began after coronary calcium score was 754, 99th percentile.  Main symptom over the past several years has been dyspnea on exertion.  Somewhat progressive.  Did not feel significant changes post PCI.  Still has occasional right of sternal chest discomfort at rest.  Past Medical History:  Diagnosis Date   Adenomatous polyp of colon    Allergy    Cancer (HCC)    hoRieselns lymphona   Cardiac murmur    Dyspnea on exertion  Elevated coronary artery calcium score    H/O splenectomy    Headache    Hematuria, microscopic    Hodgkin lymphoma (HCC)    Hyperlipidemia    Insomnia    PSA (psoriatic arthritis) ([place])    Restrictive lung disease    Thyroid disease     Past Surgical History:  Procedure Laterality Date   ABDOMINAL EXPLORATION SURGERY     spleen removed   CORONARY PRESSURE WIRE/FFR WITH 3D MAPPING N/A  09/29/2020   Procedure: Coronary Pressure Wire/FFR w/3D Mapping;  Surgeon: Belva Crome, MD;  Location: [place] [facility];  Service: Cardiovascular;  Laterality: N/A;   LEFT HEART CATH AND CORONARY ANGIOGRAPHY N/A 09/29/2020   Procedure: LEFT HEART CATH AND CORONARY ANGIOGRAPHY;  Surgeon: Belva Crome, MD;  Location: [place] [facility];  Service: Cardiovascular;  Laterality: N/A;   UPPER GASTROINTESTINAL ENDOSCOPY     ucler - 15 yrs ago    Current Medications: Current Meds  Medication Sig   aspirin EC 81 MG tablet Take 1 tablet (81 mg total) by mouth daily. Swallow whole.   levothyroxine (SYNTHROID, LEVOTHROID) 88 MCG tablet Take 88 mcg by mouth daily.     Allergies:   Compazine  [prochlorperazine], Penicillins, and Prochlorperazine edisylate   Social History   Socioeconomic History   Marital status: Married    Spouse name: Not on file   Number of children: Not on file   Years of education: Not on file   Highest education level: Not on file  Occupational History   Not on file  Tobacco Use   Smoking status: Never   Smokeless tobacco: Never  Vaping Use   Vaping Use: Never used  Substance and Sexual Activity   Alcohol use: Yes    Comment: occasional wine - 2-3 x month   Drug use: Never   Sexual activity: Yes  Other Topics Concern   Not on file  Social History Narrative   Not on file   Social Determinants of Health   Financial Resource Strain: Not on file  Food Insecurity: Not on file  Transportation Needs: Not on file  Physical Activity: Not on file  Stress: Not on file  Social Connections: Not on file     Family History: The patient's family history is negative for Colon cancer, Rectal cancer, and Stomach cancer.  ROS:   Please see the history of present illness.     All other systems reviewed and are negative.  EKGs/Labs/Other Studies Reviewed:    The following studies were reviewed today: Catheterization reports from [facility] lab work  EKG:   EKG is  ordered today.   04/16/2022-sinus rhythm 82 no other abnormalities.  Prior sinus rhythm 78 no other abnormalities.  No change from prior EKG.  Recent Labs: No results found for requested labs within last 365 days.  Recent Lipid Panel No results found for: "CHOL", "TRIG", "HDL", "CHOLHDL", "VLDL", "LDLCALC", "LDLDIRECT"   Risk Assessment/Calculations:              Physical Exam:    VS:  BP 132/60   Pulse 82   Ht '6\' 5"'$  (1.956 m)   Wt 177 lb 12.8 oz (80.6 kg)   SpO2 99%   BMI 21.08 kg/m     Wt Readings from Last 3 Encounters:  04/16/22 177 lb 12.8 oz (80.6 kg)  03/24/21 176 lb (79.8 kg)  10/12/20 175 lb (79.4 kg)     GEN: Thin, tall in no acute distress  HEENT: Normal NECK: No JVD; No carotid bruits LYMPHATICS: No lymphadenopathy CARDIAC: RRR, 1/6 systolic murmur right upper sternal border as well as apex, no rubs, gallops RESPIRATORY:  Clear to auscultation without rales, wheezing or rhonchi  ABDOMEN: Soft, non-tender, non-distended MUSCULOSKELETAL:  No edema; No deformity, catheterization sites intact.  Normal pulses SKIN: Warm and dry NEUROLOGIC:  Alert and oriented x 3 PSYCHIATRIC:  Normal affect   ASSESSMENT:    1. Radiation-induced coronary artery disease   2. Chest pain of uncertain etiology   3. Aortic valve sclerosis     PLAN:    In order of problems listed above:   Radiation-induced coronary artery disease Radiation-induced coronary artery disease.  Appreciate Dr. Mauricio Po expertise as well as Dr. Shelly Coss from the [facility] clinic for collaborative efforts.  Successful PCI as noted above.  RCA, LAD.  PTCA only to a small anomalous circumflex.  Still having some mild nagging atypical discomfort right of sternum.  Likely inflammatory/MSK.  EKG unremarkable.    At this point, he would like to stay off of the rosuvastatin.  LDL 123.  It would not be unreasonable for him to be on low-dose statin.  He has tried in the past.  He completed dual  antiplatelet therapy for a year.  He is now on aspirin monotherapy.  Aortic sclerosis noted on echocardiogram personally reviewed, showed images.  No evidence of stenosis at this point.  Echo from [facility] clinic reviewed.  Aortic valve sclerosis Echo reviewed as above.  No stenosis.  Prior radiation Murmur heard on exam.  No evidence of conduction issues. No fibrotic type crackles heard on lung exam.  Offered him pulmonary evaluation.  He will hold off at this time.     Several questions answered.       Medication Adjustments/Labs and Tests Ordered: Current medicines are reviewed at length with the patient today.  Concerns regarding medicines are outlined above.  Orders Placed This Encounter  Procedures   EKG 12-Lead   No orders of the defined types were placed in this encounter.   Patient Instructions  Medication Instructions:  Your physician recommends that you continue on your current medications as directed. Please refer to the Current Medication list given to you today.  *If you need a refill on your cardiac medications before your next appointment, please call your pharmacy*   Lab Work: None. If you have labs (blood work) drawn today and your tests are completely normal, you will receive your results only by: [place] (if you have MyChart) OR A paper copy in the mail If you have any lab test that is abnormal or we need to change your treatment, we will call you to review the results.   Testing/Procedures: None.   Follow-Up: At [facility], you and your health needs are our priority.  As part of our continuing mission to provide you with exceptional heart care, we have created designated Provider Care Teams.  These Care Teams include your primary Cardiologist (physician) and Advanced Practice Providers (APPs -  Physician Assistants and Nurse Practitioners) who all work together to provide you with the care you need, when you need it.  We  recommend signing up for the patient portal called "MyChart".  Sign up information is provided on this After Visit Summary.  MyChart is used to connect with patients for Virtual Visits (Telemedicine).  Patients are able to view lab/test results, encounter notes, upcoming appointments, etc.  Non-urgent messages can be sent to your provider as well.  To learn more about what you can do with MyChart, go to [email or web address].    Your next appointment:   1 year(s)  Provider:   Dr. Candee Furbish, MD      Signed, Candee Furbish, MD  04/16/2022 10:05 AM    [place]

## 2022-05-30 ENCOUNTER — Encounter: Payer: Self-pay | Admitting: Physician Assistant

## 2022-06-12 ENCOUNTER — Telehealth: Payer: Self-pay | Admitting: Internal Medicine

## 2022-06-12 NOTE — Telephone Encounter (Signed)
Contacted by patient directly  He has a history of multiple adenomatous colon polyps Colonoscopy March 2021 with Oretha Caprice  Due for surveillance at this time Previously on Plavix x 1 year for PCI performed at [facility] clinic in December 2022 He is now off of Plavix entirely and taking only 81 mg aspirin daily  Please schedule direct surveillance colonoscopy in the [place] with me He will need a previsit He does not need to be seen in the office first  He prefers an earlier morning appointment such as 8, 830 or 9 AM for his procedure  Thank you JMP

## 2022-06-13 NOTE — Telephone Encounter (Signed)
Previsit and colonoscopy have been scheduled and myhart has been sent to the patient.

## 2022-07-12 ENCOUNTER — Ambulatory Visit (AMBULATORY_SURGERY_CENTER): Payer: Managed Care, Other (non HMO)

## 2022-07-12 ENCOUNTER — Ambulatory Visit: Payer: Managed Care, Other (non HMO) | Admitting: Physician Assistant

## 2022-07-12 VITALS — Ht 77.0 in | Wt 177.0 lb

## 2022-07-12 DIAGNOSIS — Z86010 Personal history of colonic polyps: Secondary | ICD-10-CM

## 2022-07-12 MED ORDER — NA SULFATE-K SULFATE-MG SULF 17.5-3.13-1.6 GM/177ML PO SOLN: 1.0000 | Freq: Once | ORAL | 0 refills | Status: AC

## 2022-07-12 NOTE — Progress Notes (Signed)

## 2022-08-10 ENCOUNTER — Encounter: Payer: Self-pay | Admitting: Internal Medicine

## 2022-08-20 ENCOUNTER — Encounter: Payer: Self-pay | Admitting: Internal Medicine

## 2022-08-20 ENCOUNTER — Ambulatory Visit (AMBULATORY_SURGERY_CENTER): Payer: Managed Care, Other (non HMO) | Admitting: Internal Medicine

## 2022-08-20 VITALS — BP 112/64 | HR 75 | Temp 97.3°F | Resp 12 | Ht 77.0 in | Wt 177.0 lb

## 2022-08-20 DIAGNOSIS — D122 Benign neoplasm of ascending colon: Secondary | ICD-10-CM

## 2022-08-20 DIAGNOSIS — Z86010 Personal history of colonic polyps: Secondary | ICD-10-CM | POA: Diagnosis not present

## 2022-08-20 DIAGNOSIS — D123 Benign neoplasm of transverse colon: Secondary | ICD-10-CM

## 2022-08-20 DIAGNOSIS — D120 Benign neoplasm of cecum: Secondary | ICD-10-CM | POA: Diagnosis not present

## 2022-08-20 DIAGNOSIS — Z09 Encounter for follow-up examination after completed treatment for conditions other than malignant neoplasm: Secondary | ICD-10-CM

## 2022-08-20 DIAGNOSIS — D125 Benign neoplasm of sigmoid colon: Secondary | ICD-10-CM | POA: Diagnosis not present

## 2022-08-20 MED ORDER — SODIUM CHLORIDE 0.9 % IV SOLN: 500.0000 mL | INTRAVENOUS | Status: DC

## 2022-08-20 NOTE — Progress Notes (Signed)
GASTROENTEROLOGY PROCEDURE H&P NOTE   Primary Care Physician: Cleatis Polka., MD    Reason for Procedure:  History of colon polyps  Plan:    Colonoscopy  Patient is appropriate for endoscopic procedure(s) in the ambulatory ([facility]) setting.  The nature of the procedure, as well as the risks, benefits, and alternatives were carefully and thoroughly reviewed with the patient. Ample time for discussion and questions allowed. The patient understood, was satisfied, and agreed to proceed.     HPI: Ian Goodman is a 52 y.o. male who presents for surveillance colonoscopy.  Medical history as below.  Tolerated the prep.  No recent chest pain or shortness of breath.  No abdominal pain today.  Past Medical History:  Diagnosis Date   Adenomatous polyp of colon    Allergy    Cancer (HCC)    hodgins lymphona   Dyspnea on exertion    Elevated coronary artery calcium score    H/O splenectomy    Headache    Hematuria, microscopic    Hodgkin lymphoma (HCC)    Hyperlipidemia    Insomnia    Restrictive lung disease    Thyroid disease     Past Surgical History:  Procedure Laterality Date   ABDOMINAL EXPLORATION SURGERY     spleen removed   CORONARY PRESSURE/FFR WITH 3D MAPPING N/A 09/29/2020   Procedure: Coronary Pressure Wire/FFR w/3D Mapping;  Surgeon: Lyn Records, MD;  Location: [facility];  Service: Cardiovascular;  Laterality: N/A;   LEFT HEART CATH AND CORONARY ANGIOGRAPHY N/A 09/29/2020   Procedure: LEFT HEART CATH AND CORONARY ANGIOGRAPHY;  Surgeon: Lyn Records, MD;  Location: [facility];  Service: Cardiovascular;  Laterality: N/A;   SHOULDER SURGERY Left 08/2019   UPPER GASTROINTESTINAL ENDOSCOPY     ucler - 15 yrs ago    Prior to Admission medications   Medication Sig Start Date End Date Taking? Authorizing Provider  aspirin EC 81 MG tablet Take 1 tablet (81 mg total) by mouth daily. Swallow whole. 09/20/20  Yes Jake Bathe, MD   hydrocortisone 2.5 % ointment SMARTSIG:1 Sparingly Topical Twice Daily 07/09/22  Yes [provider]  levothyroxine (SYNTHROID, LEVOTHROID) 88 MCG tablet Take 88 mcg by mouth daily. 10/20/15  Yes [provider]  rosuvastatin (CRESTOR) 10 MG tablet 1 tablet Orally Once a day at bedtime for 30 day(s)   Yes [provider]    Current Outpatient Medications  Medication Sig Dispense Refill   aspirin EC 81 MG tablet Take 1 tablet (81 mg total) by mouth daily. Swallow whole. 90 tablet 3   hydrocortisone 2.5 % ointment SMARTSIG:1 Sparingly Topical Twice Daily     levothyroxine (SYNTHROID, LEVOTHROID) 88 MCG tablet Take 88 mcg by mouth daily.  5   rosuvastatin (CRESTOR) 10 MG tablet 1 tablet Orally Once a day at bedtime for 30 day(s)     Current Facility-Administered Medications  Medication Dose Route Frequency Provider Last Rate Last Admin   0.9 %  sodium chloride infusion  500 mL Intravenous Continuous Doral Digangi, Carie Caddy, MD        Allergies as of 08/20/2022 - Review Complete 08/20/2022  Allergen Reaction Noted   Compazine  [prochlorperazine] Anaphylaxis 05/25/2019   Nitroglycerin Nausea And Vomiting 01/02/2021   Penicillins Other (See Comments)    Prochlorperazine edisylate Other (See Comments)     Family History  Problem Relation Age of Onset   Colon cancer Neg Hx    Rectal cancer Neg  Hx    Stomach cancer Neg Hx    Colon polyps Neg Hx    Esophageal cancer Neg Hx     Social History   Socioeconomic History   Marital status: Married    Spouse name: Not on file   Number of children: Not on file   Years of education: Not on file   Highest education level: Not on file  Occupational History   Not on file  Tobacco Use   Smoking status: Never   Smokeless tobacco: Never  Vaping Use   Vaping Use: Never used  Substance and Sexual Activity   Alcohol use: Yes    Comment: occasional wine - 2-3 x month   Drug use: Never   Sexual activity: Yes  Other Topics  Concern   Not on file  Social History Narrative   Not on file   Social Determinants of Health   Financial Resource Strain: Not on file  Food Insecurity: Not on file  Transportation Needs: Not on file  Physical Activity: Not on file  Stress: Not on file  Social Connections: Not on file  Intimate Partner Violence: Not on file    Physical Exam: Vital signs in last 24 hours: @BP  115/75   Pulse 84   Temp (!) 97.3 F (36.3 C)   Ht 6\' 5"  (1.956 m)   Wt 177 lb (80.3 kg)   SpO2 100%   BMI 20.99 kg/m  GEN: NAD EYE: Sclerae anicteric ENT: MMM CV: Non-tachycardic Pulm: CTA b/l GI: Soft, NT/ND NEURO:  Alert & Oriented x 3   Erick Blinks, MD [facility]  08/20/2022 9:15 AM

## 2022-08-20 NOTE — Progress Notes (Signed)
Patient reports no health or medications changes since pre visit.

## 2022-08-20 NOTE — Progress Notes (Signed)
Uneventful anesthetic. Report to pacu rn. Vss. Care resumed by rn. 

## 2022-08-20 NOTE — Op Note (Signed)
[facility] Patient Name: Ian Goodman Procedure Date: 08/20/2022 9:14 AM MRN: [id number] Endoscopist: Beverley Fiedler , MD, [id number] Age: 52 Referring MD:  Date of Birth: 1970-09-23 Gender: Male Account #: [id number] Procedure:                Colonoscopy Indications:              High risk colon cancer surveillance: Personal                            history of multiple adenomas, Last colonoscopy:                            March 2021 (12 adenomas, ranging in size from 2-10                            mm) Medicines:                Monitored Anesthesia Care Procedure:                Pre-Anesthesia Assessment:                           - Prior to the procedure, a History and Physical                            was performed, and patient medications and                            allergies were reviewed. The patient's tolerance of                            previous anesthesia was also reviewed. The risks                            and benefits of the procedure and the sedation                            options and risks were discussed with the patient.                            All questions were answered, and informed consent                            was obtained. Prior Anticoagulants: The patient has                            taken no anticoagulant or antiplatelet agents. ASA                            Grade Assessment: II - A patient with mild systemic                            disease. After reviewing the risks and benefits,  the patient was deemed in satisfactory condition to                            undergo the procedure.                           After obtaining informed consent, the colonoscope                            was passed under direct vision. Throughout the                            procedure, the patient's blood pressure, pulse, and                            oxygen saturations were monitored continuously. The                             Olympus CF-HQ190L [id number]) Colonoscope was                            introduced through the anus and advanced to the                            terminal ileum. The colonoscopy was performed                            without difficulty. The patient tolerated the                            procedure well. The quality of the bowel                            preparation was excellent. The ileocecal valve,                            appendiceal orifice, and rectum were photographed. Scope In: 9:25:49 AM Scope Out: 9:49:44 AM Scope Withdrawal Time: 0 hours 20 minutes 27 seconds  Total Procedure Duration: 0 hours 23 minutes 55 seconds  Findings:                 The digital rectal exam was normal.                           The terminal ileum appeared normal.                           Two sessile polyps were found in the cecum. The                            polyps were 1 to 2 mm in size. These polyps were                            removed with a cold biopsy forceps. Resection and  retrieval were complete.                           A 5 mm polyp was found in the cecum. The polyp was                            sessile. The polyp was removed with a cold snare.                            Resection and retrieval were complete.                           Two sessile polyps were found in the ascending                            colon. The polyps were 4 to 6 mm in size. These                            polyps were removed with a cold snare. Resection                            and retrieval were complete.                           Two sessile polyps were found in the transverse                            colon. The polyps were 4 to 5 mm in size. These                            polyps were removed with a cold snare. Resection                            and retrieval were complete.                           Two sessile polyps were found in the sigmoid colon.                             The polyps were 3 to 4 mm in size. These polyps                            were removed with a cold snare. Resection and                            retrieval were complete.                           Internal hemorrhoids were found during                            retroflexion. The hemorrhoids were small. Complications:            No  immediate complications. Estimated Blood Loss:     Estimated blood loss was minimal. Impression:               - The examined portion of the ileum was normal.                           - Two 1 to 2 mm polyps in the cecum, removed with a                            cold biopsy forceps. Resected and retrieved.                           - One 5 mm polyp in the cecum, removed with a cold                            snare. Resected and retrieved.                           - Two 4 to 6 mm polyps in the ascending colon,                            removed with a cold snare. Resected and retrieved.                           - Two 4 to 5 mm polyps in the transverse colon,                            removed with a cold snare. Resected and retrieved.                           - Two 3 to 4 mm polyps in the sigmoid colon,                            removed with a cold snare. Resected and retrieved.                           - Small internal hemorrhoids. Recommendation:           - Patient has a contact number available for                            emergencies. The signs and symptoms of potential                            delayed complications were discussed with the                            patient. Return to normal activities tomorrow.                            Written discharge instructions were provided to the  patient.                           - Resume previous diet.                           - Continue present medications.                           - Await pathology results.                           - Repeat  colonoscopy is recommended for                            surveillance. The colonoscopy date will be                            determined after pathology results from today's                            exam become available for review. Beverley Fiedler, MD 08/20/2022 9:55:53 AM This report has been signed electronically.

## 2022-08-20 NOTE — Progress Notes (Signed)
Called to room to assist during endoscopic procedure.  Patient ID and intended procedure confirmed with present staff. Received instructions for my participation in the procedure from the performing physician.  

## 2022-08-20 NOTE — Patient Instructions (Addendum)
-  Handout on polyps, hemorrhoids provided -await pathology results -repeat colonoscopy for surveillance recommended. Date to be determined when pathology result become available   -Continue present medications    YOU HAD AN ENDOSCOPIC PROCEDURE TODAY AT THE [facility]:   Refer to the procedure report that was given to you for any specific questions about what was found during the examination.  If the procedure report does not answer your questions, please call your gastroenterologist to clarify.  If you requested that your care partner not be given the details of your procedure findings, then the procedure report has been included in a sealed envelope for you to review at your convenience later.  YOU SHOULD EXPECT: Some feelings of bloating in the abdomen. Passage of more gas than usual.  Walking can help get rid of the air that was put into your GI tract during the procedure and reduce the bloating. If you had a lower endoscopy (such as a colonoscopy or flexible sigmoidoscopy) you may notice spotting of blood in your stool or on the toilet paper. If you underwent a bowel prep for your procedure, you may not have a normal bowel movement for a few days.  Please Note:  You might notice some irritation and congestion in your nose or some drainage.  This is from the oxygen used during your procedure.  There is no need for concern and it should clear up in a day or so.  SYMPTOMS TO REPORT IMMEDIATELY:  Following lower endoscopy (colonoscopy or flexible sigmoidoscopy):  Excessive amounts of blood in the stool  Significant tenderness or worsening of abdominal pains  Swelling of the abdomen that is new, acute  Fever of 100F or higher  For urgent or emergent issues, a gastroenterologist can be reached at any hour by calling [phone]. Do not use MyChart messaging for urgent concerns.    DIET:  We do recommend a small meal at first, but then you may proceed to your regular diet.   Drink plenty of fluids but you should avoid alcoholic beverages for 24 hours.  ACTIVITY:  You should plan to take it easy for the rest of today and you should NOT DRIVE or use heavy machinery until tomorrow (because of the sedation medicines used during the test).    FOLLOW UP: Our staff will call the number listed on your records the next business day following your procedure.  We will call around 7:15- 8:00 am to check on you and address any questions or concerns that you may have regarding the information given to you following your procedure. If we do not reach you, we will leave a message.     If any biopsies were taken you will be contacted by phone or by letter within the next 1-3 weeks.  Please call us at [phone] if you have not heard about the biopsies in 3 weeks.    SIGNATURES/CONFIDENTIALITY: You and/or your care partner have signed paperwork which will be entered into your electronic medical record.  These signatures attest to the fact that that the information above on your After Visit Summary has been reviewed and is understood.  Full responsibility of the confidentiality of this discharge information lies with you and/or your care-partner.  

## 2022-08-21 ENCOUNTER — Telehealth: Payer: Self-pay

## 2022-08-21 NOTE — Telephone Encounter (Signed)
  Follow up Call-     08/20/2022    8:52 AM  Call back number  Post procedure Call Back phone  # [phone]  Permission to leave phone message Yes     Patient questions:  Do you have a fever, pain , or abdominal swelling? No. Pain Score  0 *  Have you tolerated food without any problems? Yes.    Have you been able to return to your normal activities? Yes.    Do you have any questions about your discharge instructions: Diet   No. Medications  No. Follow up visit  No.  Do you have questions or concerns about your Care? No.  Actions: * If pain score is 4 or above: No action needed, pain <4.

## 2022-08-23 ENCOUNTER — Encounter: Payer: Self-pay | Admitting: Internal Medicine

## 2022-08-28 ENCOUNTER — Other Ambulatory Visit: Payer: Self-pay

## 2022-08-28 DIAGNOSIS — D120 Benign neoplasm of cecum: Secondary | ICD-10-CM

## 2022-08-28 DIAGNOSIS — D125 Benign neoplasm of sigmoid colon: Secondary | ICD-10-CM

## 2022-08-28 DIAGNOSIS — Z86010 Personal history of colonic polyps: Secondary | ICD-10-CM

## 2022-08-28 DIAGNOSIS — D123 Benign neoplasm of transverse colon: Secondary | ICD-10-CM

## 2022-08-28 DIAGNOSIS — D122 Benign neoplasm of ascending colon: Secondary | ICD-10-CM

## 2022-08-29 ENCOUNTER — Telehealth: Payer: Self-pay | Admitting: Internal Medicine

## 2022-08-29 NOTE — Telephone Encounter (Signed)
Left a message regarding [employer]

## 2022-11-15 IMAGING — CT CT CARDIAC CORONARY ARTERY CALCIUM SCORE
2 of 3 series · 10 of 20 positions shown, 12 images · non-contrast
Comparison: None.

CLINICAL DATA: 49-year-old Caucasian male with history of
hyperlipidemia and family history of heart disease.

EXAM:
CT CARDIAC CORONARY ARTERY CALCIUM SCORE
TECHNIQUE: Non-contrast imaging through the heart was performed using
prospective ECG gating. Image post processing was performed on an
independent workstation, allowing for quantitative analysis of the
heart and coronary arteries. Note that this exam targets the heart
and the chest was not imaged in its entirety.

[Series 4: calcium scoring 2.00 br40 bestdiast 71% axial · axial · 0.47mm/px · z∈[+1661,+1771]mm · 5 of 83 slices shown, 7 images]
[im 14/83  vessel]
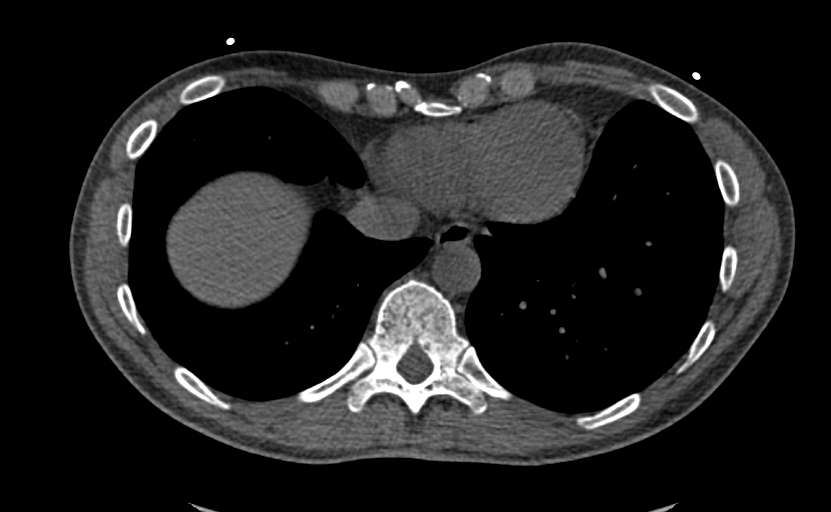
[im 14/83  lung]
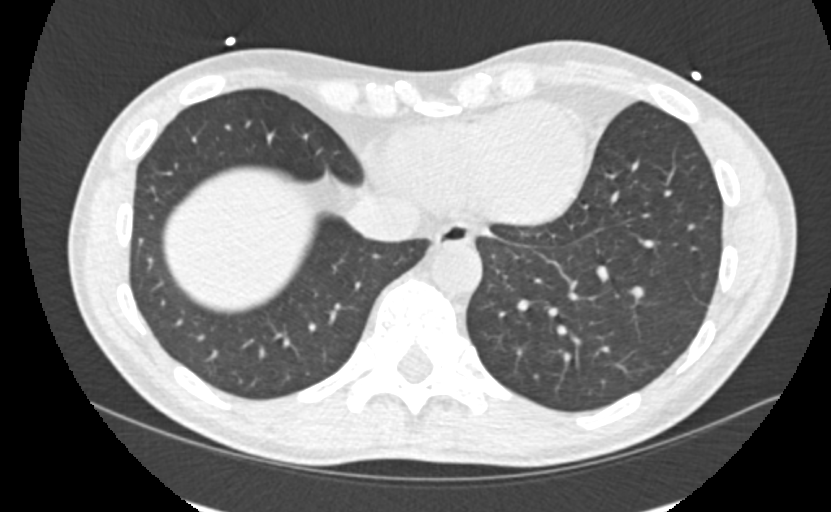
[im 28/83  vessel]
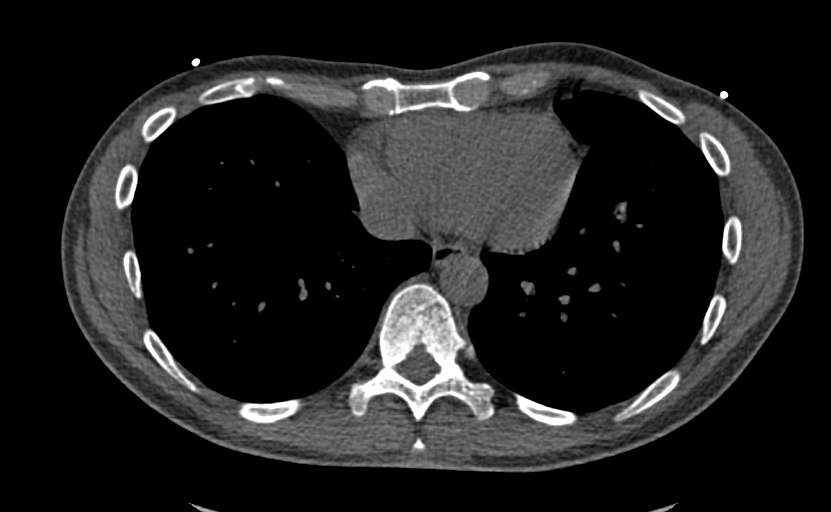
[im 42/83  vessel]
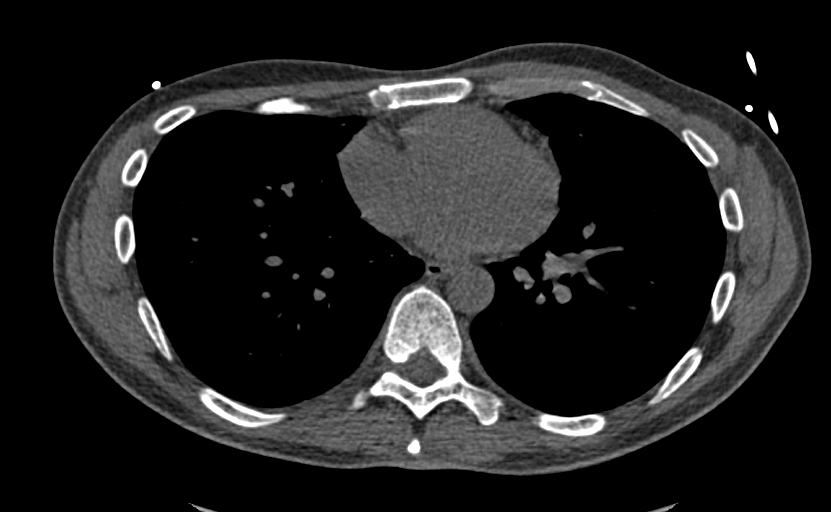
[im 55/83  vessel]
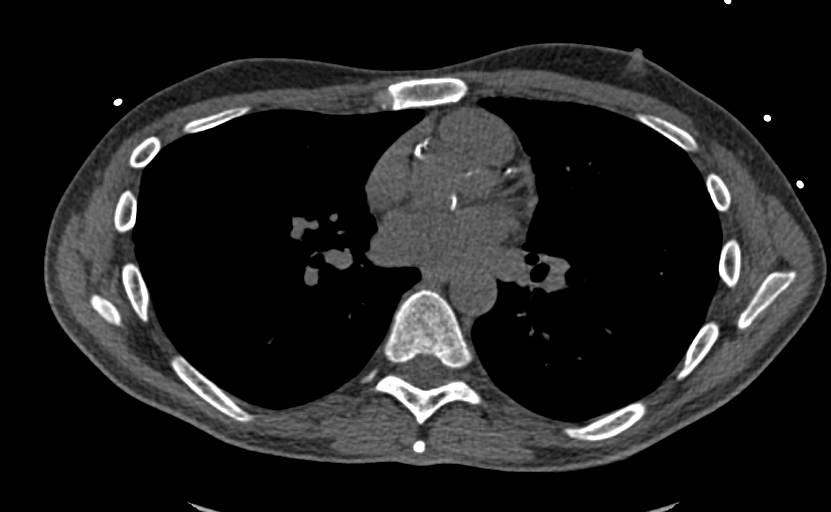
[im 69/83  vessel]
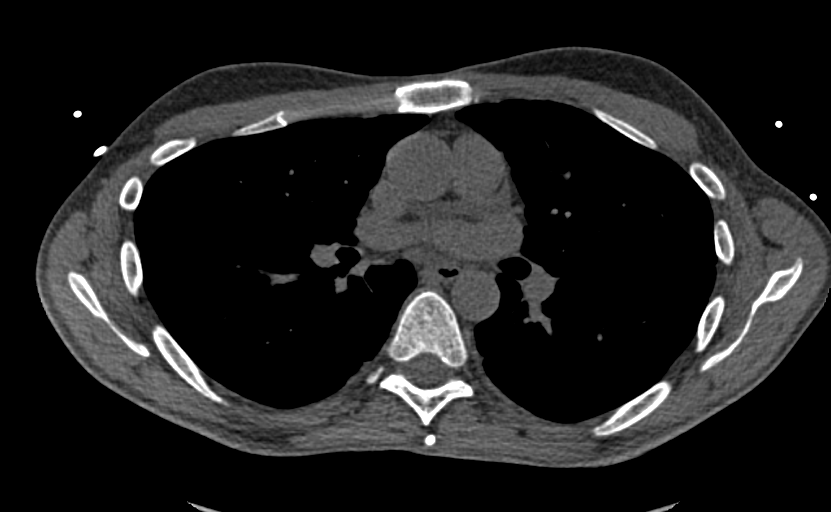
[im 69/83  lung]
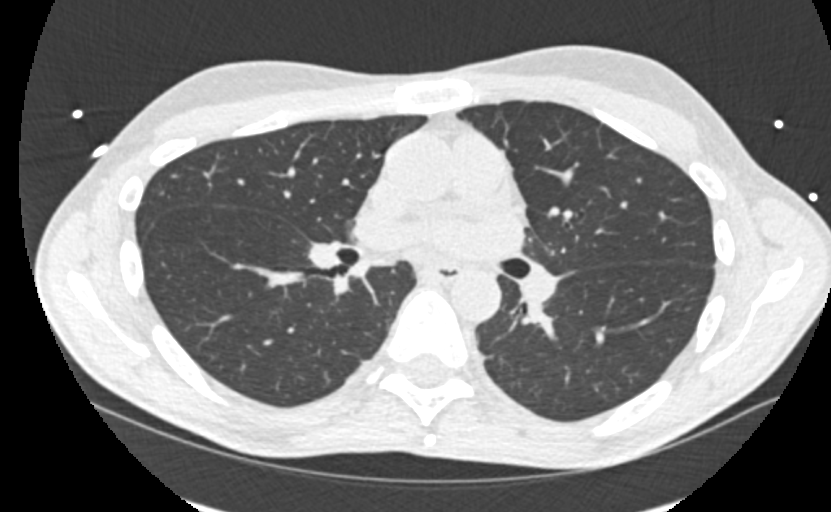

[Series 10: calcium scoring 2.00 br60 bestdiast 71% lungs · axial · 0.47mm/px · z∈[+1661,+1771]mm · 5 of 83 slices shown]
[im 14/83  vessel]
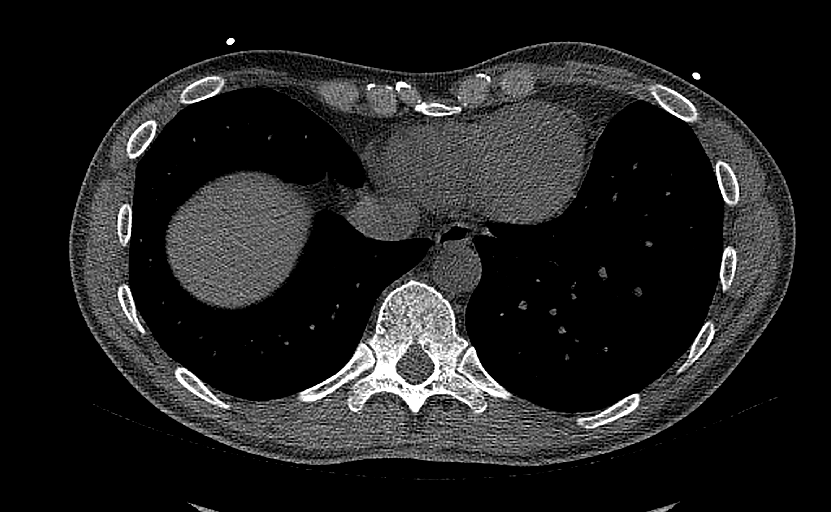
[im 28/83  vessel]
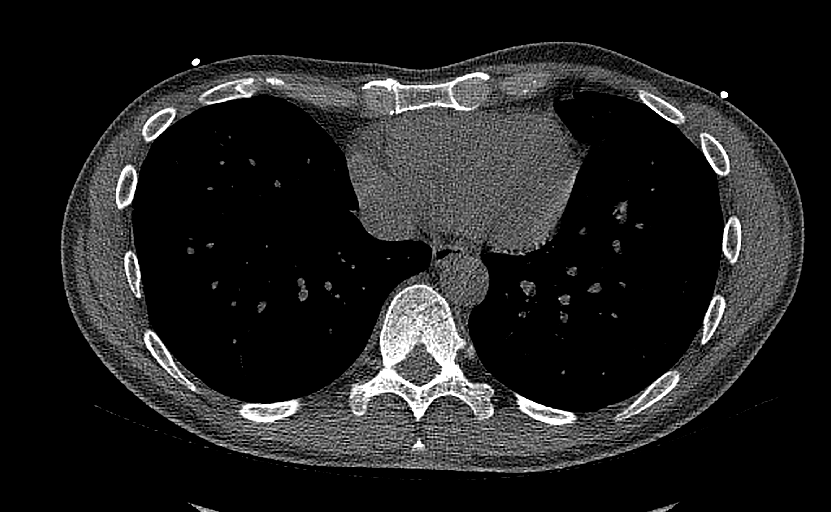
[im 42/83  vessel]
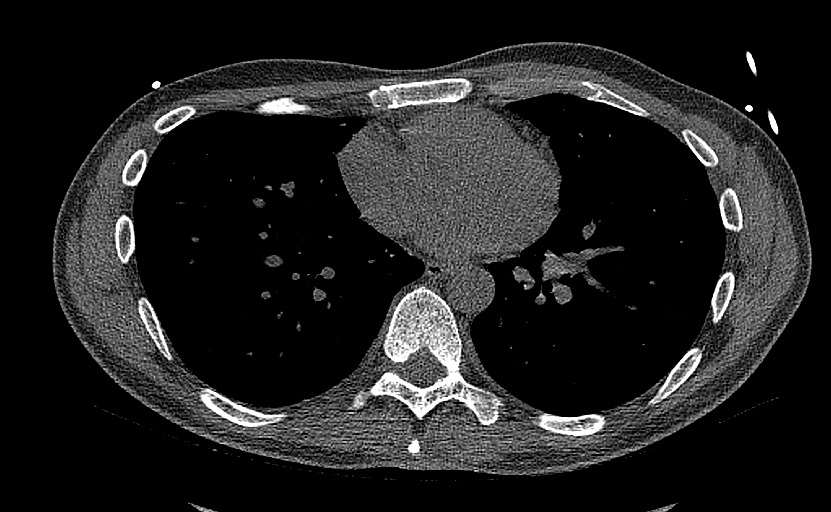
[im 55/83  vessel]
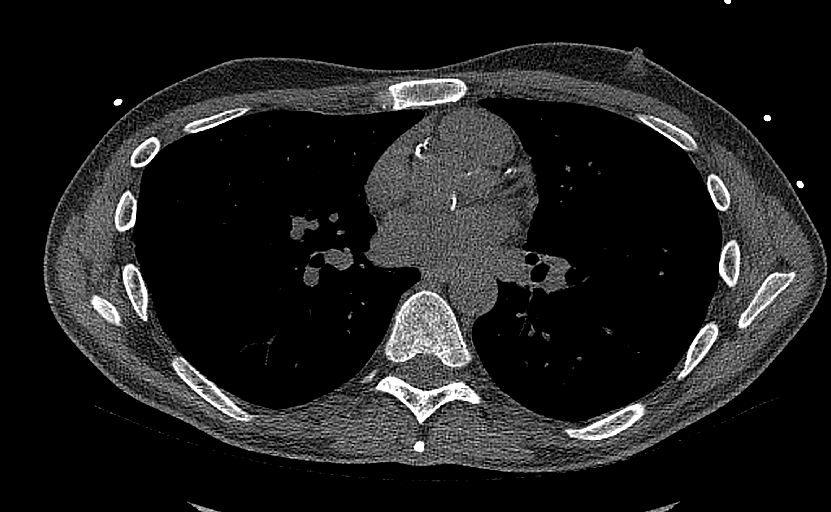
[im 69/83  vessel]
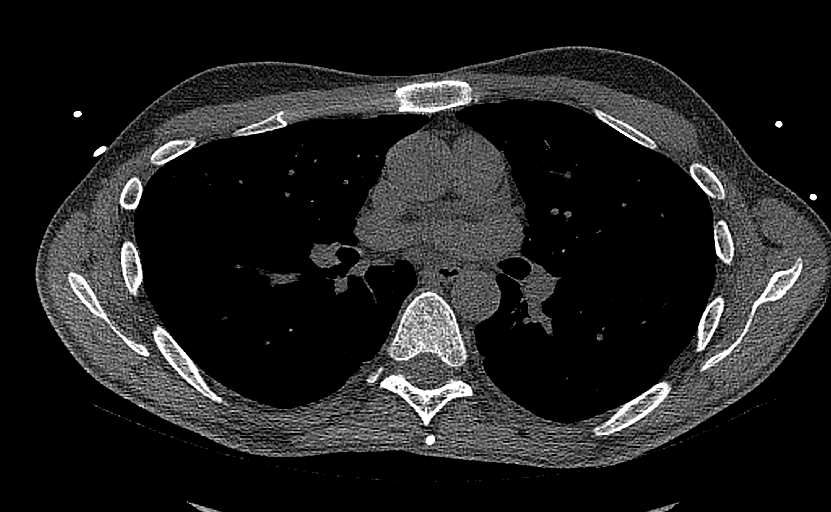

[10 of 20 positions shown; findings below may reference images not displayed]

FINDINGS: CORONARY CALCIUM SCORES:

Left Main: 0

LAD: 283

LCx: 0

RCA: 470

Total Agatston Score: 754

[HOSPITAL] percentile: 99

AORTA MEASUREMENTS:

Ascending Aorta: 31 mm

Descending Aorta: 22 mm

OTHER FINDINGS:

The heart size is within normal limits. No pericardial fluid
identified. Visualized thoracic aorta and central pulmonary arteries
are normal in caliber. There are some calcifications involving the
aortic valve and aortic root. Visualized mediastinum and hilar
regions demonstrate no lymphadenopathy or masses. Visualized lungs
show no evidence of pulmonary edema, consolidation, pneumothorax,
nodule or pleural fluid. Visualized upper abdomen and bony
structures are unremarkable.
IMPRESSION: 1. Coronary calcium score of 754 is at the 99th percentile for the
patient's age, sex and race.
2. Calcifications involving the aortic root and aortic valve.
Correlation with echocardiography may be helpful to evaluate aortic
valve morphology and function.

## 2023-05-31 ENCOUNTER — Ambulatory Visit: Payer: Managed Care, Other (non HMO) | Attending: Cardiology | Admitting: Cardiology

## 2023-05-31 ENCOUNTER — Encounter: Payer: Self-pay | Admitting: Cardiology

## 2023-05-31 VITALS — BP 120/74 | HR 88 | Ht 77.0 in | Wt 175.2 lb

## 2023-05-31 DIAGNOSIS — I2510 Atherosclerotic heart disease of native coronary artery without angina pectoris: Secondary | ICD-10-CM | POA: Diagnosis not present

## 2023-05-31 DIAGNOSIS — C8192 Hodgkin lymphoma, unspecified, intrathoracic lymph nodes: Secondary | ICD-10-CM

## 2023-05-31 DIAGNOSIS — I358 Other nonrheumatic aortic valve disorders: Secondary | ICD-10-CM | POA: Diagnosis not present

## 2023-05-31 NOTE — Progress Notes (Signed)
 Cardiology Office Note:  .   Date:  05/31/2023  ID:  Caryl Never, DOB [birth year], MRN [id number] PCP: Cleatis Polka., MD  [facility] Providers Cardiologist:  None    History of Present Illness: .   Hillard Goodwine is a 53 y.o. 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 The radiation involved 3600 rad to the mantle field and 3060 rad to the periaortic nodes. 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 Additionally, IVUS guided PCI to proximal and mid LAD with a 3.5 by 38 mm stent was performed. Despite these interventions, there is no significant improvement in respiratory stamina and he experienced occasional atypical discomfort to the right of the sternum.  No episodes of waking up feeling out of breath or experiencing pain while sitting or walking. However, during physical exertion, such as playing tennis, he feels his pulse increase and experiences difficulty catching his breath, with a slower recovery time than expected. He describes his exercise tolerance as 'normal for me' but is disappointed in the lack of improvement post-intervention.  He is currently on aspirin monotherapy after completing a year of dual antiplatelet therapy. He previously had an LDL of 123 and preferred to avoid rosuvastatin, opting instead for lovastatin. His LDL has since decreased to 80, and he is  on a lower medium dose of Crestor, which he takes as an anti-inflammatory measure.  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 No evidence of conduction disease or fibrotic crackles on examination.  He also has a history of adenomatous polyps, with a recent colonoscopy revealing multiple polyps, leading to a recommendation for a repeat colonoscopy in two years. He is concerned about the potential radiation-induced nature of these polyps and the frequency of their occurrence.        Studies Reviewed: Marland Kitchen   EKG Interpretation Date/Time:  Friday May 31 2023 09:34:32 EDT Ventricular Rate:  88 PR Interval:  152 QRS Duration:  84 QT Interval:  346 QTC Calculation: 418 R Axis:   64  Text Interpretation: Normal sinus rhythm Possible Left atrial enlargement When compared with ECG of 12-Oct-2020 09:54, improved baseline ST segments inferiorly Confirmed by Donato Schultz ([id number]) on 05/31/2023 10:40:08 AM    Results LABS LDL: 80 (08/2022) Triglycerides: 55 (08/2022) HDL: 36 (08/2022)  DIAGNOSTIC Colonoscopy: Nine polyps removed: two 1-2 mm polyps in cecum, one 5 mm polyp in cecum, two 2 mm polyps in transverse colon, two 2 mm polyps in sigmoid colon (08/20/2022) Echocardiogram: Aortic valve calcified but not stenotic Pulmonary Function Test: Incomplete test (2022) Risk Assessment/Calculations:            Physical Exam:   VS:  BP 120/74   Pulse 88   Ht 6\' 5"  (1.956 m)   Wt 175 lb 3.2 oz (79.5 kg)   SpO2 98%   BMI 20.78 kg/m    Wt Readings from Last  3 Encounters:  05/31/23 175 lb 3.2 oz (79.5 kg)  08/20/22 177 lb (80.3 kg)  07/12/22 177 lb (80.3 kg)    GEN: Well nourished, well developed in no acute distress NECK: No JVD; No carotid bruits CARDIAC: RRR, 1/6 SM, no rubs, no gallops RESPIRATORY:  Clear to auscultation without rales, wheezing or rhonchi  ABDOMEN: Soft, non-tender, non-distended EXTREMITIES:   No edema; No deformity   ASSESSMENT AND PLAN: .    Assessment and Plan Assessment & Plan Radiation-induced coronary artery disease He has radiation-induced coronary artery disease post-treatment for Hodgkin's lymphoma. 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 Reports no significant improvement in respiratory stamina and experiences atypical chest discomfort. Discussed potential fibrotic lung changes due to radiation and considered stent failure unlikely with newer generation stents (rare). Expressed disappointment with lack of improvement post-intervention. Discussed potential for repeat pulmonary function tests to assess fibrotic changes. 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 Recent echocardiograms show calcification but no significant stenosis or conduction disease. No new symptoms suggestive of progression.  Hyperlipidemia LDL improved to 80 mg/dL on rosuvastatin, down from 123 mg/dL. Triglycerides are excellent at 55 mg/dL, but HDL is low at 36 mg/dL. Prefers to minimize statin use but acknowledges benefits of rosuvastatin as an anti-inflammatory agent. Discussed potential for exercise to improve HDL levels. 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 Managed on levothyroxine with no reported issues. - Continue levothyroxine 88 mcg daily - Monitor thyroid function tests regularly  Adenomatous polyp of colon He has adenomatous polyps with recent colonoscopy revealing multiple polyps. Polyps were removed, and pathology showed no malignancy. On a two-year surveillance interval due to the number of polyps found. - Schedule follow-up  colonoscopy in June 2026  Follow-up  1 yr          Signed, Donato Schultz, MD

## 2023-05-31 NOTE — Patient Instructions (Signed)
 Follow-Up: At [facility], you and your health needs are our priority.  As part of our continuing mission to provide you with exceptional heart care, we have created designated Provider Care Teams.  These Care Teams include your primary Cardiologist (physician) and Advanced Practice Providers (APPs -  Physician Assistants and Nurse Practitioners) who all work together to provide you with the care you need, when you need it.   Your next appointment:   1 year(s)  Provider:   Donato Schultz, MD      1st Floor: - Lobby - Registration  - Pharmacy  - Lab - Cafe  2nd Floor: - PV Lab - Diagnostic Testing (echo, CT, nuclear med)  3rd Floor: - Vacant  4th Floor: - TCTS (cardiothoracic surgery) - AFib Clinic - Structural Heart Clinic - Vascular Surgery  - Vascular Ultrasound  5th Floor: - HeartCare Cardiology (general and EP) - Clinical Pharmacy for coumadin, hypertension, lipid, weight-loss medications, and med management appointments    Valet parking services will be available as well.

## 2023-06-27 ENCOUNTER — Ambulatory Visit: Admitting: Podiatry

## 2023-06-27 ENCOUNTER — Encounter: Payer: Self-pay | Admitting: Podiatry

## 2023-06-27 VITALS — Ht 77.0 in | Wt 175.2 lb

## 2023-06-27 DIAGNOSIS — L84 Corns and callosities: Secondary | ICD-10-CM

## 2023-06-27 DIAGNOSIS — B070 Plantar wart: Secondary | ICD-10-CM

## 2023-06-27 NOTE — Patient Instructions (Signed)
 VISIT SUMMARY:  Today, you were seen for painful lesions on both feet, which are suspected to be plantar warts. You also have calluses on your feet and a hair splinter was removed during the visit.  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 You should apply salicylic acid 40% to the lesions once daily, preferably at night, until they are resolved. Leave the initial Band-Aid with salicylic acid on for 24 hours and continue treatment until normal skin lines are restored. If there is no improvement after a month, we may consider using cantharidin. Surgical removal is not recommended due to the risk of recurrence and scar formation.  -CALLUSES: Calluses are thickened areas of skin that develop due to pressure or friction. You should use a pumice stone or callus shaver daily after bathing and apply urea 40% cream to soften the calluses. Regular pedicures may also help manage this condition.  -HAIR SPLINTER: A hair splinter is a small piece of hair that gets embedded in the skin. The hair splinter was removed during your visit, and no further treatment is required.  INSTRUCTIONS:  Please follow the treatment plan for your plantar warts and calluses as discussed. If the salicylic acid treatment for the warts is not effective after a month, contact us  to consider alternative treatments. Continue to monitor your feet for any changes or new symptoms and schedule a follow-up appointment if needed.

## 2023-06-27 NOTE — Progress Notes (Signed)
 Subjective:  Patient ID: Ian Goodman, male    DOB: [birth year],  MRN: [id number]  Chief Complaint  Patient presents with   Callouses    Pt is here due to callous on bilateral feet, pt states they have been there for quite a while and occasionally hurts.    Discussed the use of AI scribe software for clinical note transcription with the patient, who gave verbal consent to proceed.  History of Present Illness Ian Goodman "Hank" is a 53 year old male who presents with painful lesions on both feet.  He has painful spots on both feet, suspected to be warts, present for an extended period. The pain is described as shooting when pressure is applied, especially when stepping in a certain way. One lesion, difficult to see, causes significant discomfort.  He wears both dress shoes and casual footwear, which may contribute to pressure on the lesions. He had a similar lesion surgically removed about eight or nine years ago, which recurred.  He has two dogs, one a black lab mix with short hair and the other with longer hair requiring regular grooming.       Objective:    Physical Exam VASCULAR: DP and PT pulse palpable. Foot is warm and well-perfused. Capillary fill time is brisk. DERMATOLOGIC: Pinpoint verruca plantaris vs porokeratosis, submetatarsal 5 on left and submetatarsal 1 on right. Small hair splinter present. Diffuse callus medial hallux, submetatarsal 5 and submetatarsal 1 bilateral. Hard lump felt, possible wart or foreign body. Normal skin turgor, texture, and temperature.  NEUROLOGIC: Normal sensation to light touch and pressure. No paresthesias on examination. ORTHOPEDIC: Smooth pain-free range of motion of all examined joints. No ecchymosis or bruising. No gross deformity.    No images are attached to the encounter.    Results Procedure: Debridement of plantar warts Description: Debrided hyperkeratotic tissue from plantar warts. Identified central core indicative  of verruca plantaris.  Core sharply enucleated and debrided.  Applied salicylic acid and covered with an adhesive bandage.     Assessment:   1. Verruca plantaris   2. Callus of foot      Plan:  Patient was evaluated and treated and all questions answered.  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 Lesions are located submetatarsal five on the left and submetatarsal one on the right. Differential diagnosis includes porokeratosis. Verruca plantaris is a benign skin growth not associated with cancer-causing HPV types. Previously had a similar lesion surgically removed 8-9 years ago, with recurrence. Surgical removal is generally avoided due to recurrence risk and potential scar formation. Salicylic acid treatment is preferred for its effectiveness in removing viral skin layers. -Lesions were debrided and destroyed with salinocaine today in the office - Apply salicylic acid 40% to the lesions once daily, preferably at night, until resolved. - Leave the initial Band-Aid with salicylic acid on for 24 hours. - Continue treatment until normal skin lines are restored. - Consider cantharidin if salicylic acid is ineffective after a month. - I discussed that I usually try to avoid surgical removal due to recurrence and scar formation risk.  Calluses Diffuse calluses on both feet, including medial hallux, submenopausal five, and submenopausal one bilaterally. Identified as pressure calluses, likely due to foot shape and hereditary factors. Chronic issue managed with regular care. Urea 40% cream recommended to soften calluses. - Use a pumice stone or callus shaver daily after bathing. - Apply urea 40% cream  to soften calluses. - Consider regular pedicures for additional management.  Hair splinter A small hair splinter was identified and removed from the foot, likely from his  dogs. - No further treatment required as the splinter was removed during the visit.      No follow-ups on file.

## 2023-09-09 ENCOUNTER — Telehealth: Payer: Self-pay | Admitting: Cardiology

## 2023-09-09 DIAGNOSIS — I2510 Atherosclerotic heart disease of native coronary artery without angina pectoris: Secondary | ICD-10-CM

## 2023-09-09 DIAGNOSIS — R0609 Other forms of dyspnea: Secondary | ICD-10-CM

## 2023-09-09 NOTE — Telephone Encounter (Signed)
 Office visit from [facility] clinic cardiology reviewed.  He has been experiencing ongoing shortness of breath with exertion possible anginal equivalent.  Has known coronary disease with anomalous circumflex, prior stenting/PTCA see notes for full details.  It was recommended by Dr. Meade at the [facility] clinic to pursue cardiac PET imaging to exclude high risk ischemia and to hopefully ensure stent patency.  We will go ahead and order his cardiac PET stress here.  Oneil Parchment, MD

## 2023-09-10 NOTE — Telephone Encounter (Signed)
 Ordered placed for cardiac PET scan per Dr Jeffrie order.  Attestation order placed and sent to Dr Jeffrie to sign. Instructions sent to pt via MyChart

## 2023-11-20 ENCOUNTER — Encounter (HOSPITAL_COMMUNITY): Payer: Self-pay

## 2023-11-20 ENCOUNTER — Ambulatory Visit (HOSPITAL_COMMUNITY)

## 2023-11-29 ENCOUNTER — Encounter (HOSPITAL_COMMUNITY): Payer: Self-pay

## 2023-12-03 ENCOUNTER — Ambulatory Visit (HOSPITAL_COMMUNITY)
Admission: RE | Admit: 2023-12-03 | Discharge: 2023-12-03 | Disposition: A | Discharge status: Home / Self Care | Source: Ambulatory Visit | Attending: Cardiology | Admitting: Cardiology

## 2023-12-03 DIAGNOSIS — I2583 Coronary atherosclerosis due to lipid rich plaque: Secondary | ICD-10-CM | POA: Diagnosis present

## 2023-12-03 DIAGNOSIS — I2510 Atherosclerotic heart disease of native coronary artery without angina pectoris: Secondary | ICD-10-CM | POA: Diagnosis present

## 2023-12-03 DIAGNOSIS — R0609 Other forms of dyspnea: Secondary | ICD-10-CM | POA: Diagnosis not present

## 2023-12-03 MED ORDER — REGADENOSON 0.4 MG/5ML IV SOLN
INTRAVENOUS | Status: AC
  Filled 2023-12-03: qty 5

## 2023-12-03 MED ORDER — RUBIDIUM RB82 GENERATOR (RUBYFILL)
20.6400 | PACK | Freq: Once | INTRAVENOUS | Status: AC
  Administered 2023-12-03: 20.64 via INTRAVENOUS

## 2023-12-03 MED ORDER — RUBIDIUM RB82 GENERATOR (RUBYFILL)
20.6200 | PACK | Freq: Once | INTRAVENOUS | Status: AC
  Administered 2023-12-03: 20.62 via INTRAVENOUS

## 2023-12-03 MED ORDER — REGADENOSON 0.4 MG/5ML IV SOLN
0.4000 mg | Freq: Once | INTRAVENOUS | Status: AC
  Administered 2023-12-03: 0.4 mg via INTRAVENOUS

## 2023-12-03 NOTE — Progress Notes (Signed)
 Pt tolerated lexiscan . C/o SOB at start of scan, resolved at end of scan. Pt able to ambulate to waiting room.

## 2023-12-04 LAB — NM PET CT CARDIAC PERFUSION MULTI W/ABSOLUTE BLOODFLOW
Nuc Rest EF: 60 %
Nuc Stress EF: 67 %
Rest Nuclear Isotope Dose: 20.6 mCi
ST Depression (mm): 0 mm
Stress Nuclear Isotope Dose: 20.6 mCi
TID: 1.07

## 2023-12-05 ENCOUNTER — Ambulatory Visit: Payer: Self-pay | Admitting: Cardiology
